# Patient Record
Sex: Male | Born: 1960 | Race: White | Hispanic: No | Marital: Single | Smoking: Current every day smoker
Health system: Southern US, Community
[De-identification: ages and names within clinical notes are randomized; demographics above are authoritative.]

## PROBLEM LIST (undated history)

## (undated) ENCOUNTER — Encounter

## (undated) ENCOUNTER — Ambulatory Visit: Payer: MEDICARE

## (undated) ENCOUNTER — Telehealth: Attending: Clinical | Primary: Clinical

## (undated) ENCOUNTER — Encounter: Attending: Radiation Oncology | Primary: Radiation Oncology

## (undated) ENCOUNTER — Encounter: Attending: Speech-Language Pathologist | Primary: Speech-Language Pathologist

## (undated) ENCOUNTER — Telehealth

## (undated) ENCOUNTER — Encounter: Attending: Oncology | Primary: Oncology

## (undated) ENCOUNTER — Ambulatory Visit

## (undated) ENCOUNTER — Telehealth
Attending: Student in an Organized Health Care Education/Training Program | Primary: Student in an Organized Health Care Education/Training Program

## (undated) ENCOUNTER — Encounter
Attending: Student in an Organized Health Care Education/Training Program | Primary: Student in an Organized Health Care Education/Training Program

## (undated) ENCOUNTER — Encounter: Attending: Medical Oncology | Primary: Medical Oncology

## (undated) ENCOUNTER — Telehealth: Attending: Medical Oncology | Primary: Medical Oncology

## (undated) ENCOUNTER — Ambulatory Visit: Payer: MEDICARE | Attending: Medical Oncology | Primary: Medical Oncology

## (undated) ENCOUNTER — Inpatient Hospital Stay

## (undated) ENCOUNTER — Encounter: Attending: Otolaryngology | Primary: Otolaryngology

## (undated) ENCOUNTER — Ambulatory Visit: Payer: MEDICARE | Attending: Internal Medicine | Primary: Internal Medicine

## (undated) DIAGNOSIS — Z720 Tobacco use: Secondary | ICD-10-CM

## (undated) DIAGNOSIS — C8310 Mantle cell lymphoma, unspecified site: Secondary | ICD-10-CM

## (undated) DIAGNOSIS — F1010 Alcohol abuse, uncomplicated: Secondary | ICD-10-CM

## (undated) HISTORY — DX: Tobacco use: Z72.0

## (undated) HISTORY — DX: Alcohol abuse, uncomplicated: F10.10

## (undated) HISTORY — DX: Mantle cell lymphoma, unspecified site: C83.10

## (undated) MED ORDER — ACETAMINOPHEN 325 MG TABLET: ORAL | 0 days

## (undated) MED ORDER — NAPROXEN SODIUM 220 MG TABLET: ORAL | 0 days | PRN

---
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
## 2011-09-16 DIAGNOSIS — Z9481 Bone marrow transplant status: Secondary | ICD-10-CM | POA: Insufficient documentation

## 2013-09-02 MED ORDER — ONDANSETRON HCL 4 MG TABLET
ORAL | 0 days
Start: 2013-09-02 — End: ?

## 2013-09-02 MED ORDER — OMEPRAZOLE 20 MG CAPSULE,DELAYED RELEASE
ORAL | 0 days
Start: 2013-09-02 — End: ?

## 2013-09-19 MED ORDER — DOCUSATE SODIUM 100 MG CAPSULE
ORAL | 0 days
Start: 2013-09-19 — End: ?

## 2013-09-20 DIAGNOSIS — K8020 Calculus of gallbladder without cholecystitis without obstruction: Secondary | ICD-10-CM | POA: Insufficient documentation

## 2016-09-03 DIAGNOSIS — C8310 Mantle cell lymphoma, unspecified site: Secondary | ICD-10-CM | POA: Insufficient documentation

## 2017-02-11 ENCOUNTER — Ambulatory Visit
Admit: 2017-02-11 | Discharge: 2017-02-11 | Payer: Commercial Managed Care - PPO | Attending: Medical Oncology | Admitting: Medical Oncology

## 2017-02-11 DIAGNOSIS — C8310 Mantle cell lymphoma, unspecified site: Principal | ICD-10-CM

## 2017-02-17 ENCOUNTER — Ambulatory Visit
Admit: 2017-02-17 | Discharge: 2017-02-17 | Payer: Commercial Managed Care - PPO | Attending: Medical Oncology | Admitting: Medical Oncology

## 2017-02-17 DIAGNOSIS — C8310 Mantle cell lymphoma, unspecified site: Principal | ICD-10-CM

## 2017-02-23 ENCOUNTER — Ambulatory Visit
Admission: RE | Admit: 2017-02-23 | Discharge: 2017-02-23 | Disposition: A | Discharge status: Home / Self Care | Payer: MEDICARE

## 2017-02-23 ENCOUNTER — Ambulatory Visit
Admission: RE | Admit: 2017-02-23 | Discharge: 2017-02-23 | Disposition: A | Discharge status: Home / Self Care | Payer: Commercial Managed Care - PPO | Attending: Medical Oncology | Admitting: Medical Oncology

## 2017-02-23 DIAGNOSIS — C8310 Mantle cell lymphoma, unspecified site: Principal | ICD-10-CM

## 2017-03-09 ENCOUNTER — Ambulatory Visit
Admission: RE | Admit: 2017-03-09 | Discharge: 2017-03-09 | Disposition: A | Discharge status: Home / Self Care | Payer: BC Managed Care – PPO

## 2017-03-09 ENCOUNTER — Ambulatory Visit
Admission: RE | Admit: 2017-03-09 | Discharge: 2017-03-09 | Disposition: A | Discharge status: Home / Self Care | Payer: BC Managed Care – PPO | Admitting: Physician Assistant

## 2017-03-09 DIAGNOSIS — C8310 Mantle cell lymphoma, unspecified site: Principal | ICD-10-CM

## 2017-03-09 DIAGNOSIS — Z5111 Encounter for antineoplastic chemotherapy: Principal | ICD-10-CM

## 2017-03-09 DIAGNOSIS — C8318 Mantle cell lymphoma, lymph nodes of multiple sites: Principal | ICD-10-CM

## 2017-03-09 MED ORDER — ALLOPURINOL 300 MG TABLET: 300 mg | tablet | 0 refills | 0 days

## 2017-03-09 MED ORDER — ALLOPURINOL 300 MG TABLET
ORAL_TABLET | Freq: Two times a day (BID) | ORAL | 0 refills | 0.00000 days | Status: CP
Start: 2017-03-09 — End: 2017-04-06

## 2017-03-09 MED ORDER — ONDANSETRON HCL 8 MG TABLET
ORAL_TABLET | Freq: Three times a day (TID) | ORAL | 2 refills | 0.00000 days | Status: CP | PRN
Start: 2017-03-09 — End: 2017-03-09

## 2017-03-09 MED ORDER — ONDANSETRON HCL 8 MG TABLET: 8 mg | tablet | 2 refills | 0 days

## 2017-03-09 MED ORDER — PROCHLORPERAZINE MALEATE 10 MG TABLET: 10 mg | tablet | Freq: Four times a day (QID) | 2 refills | 0 days | Status: AC

## 2017-03-09 MED ORDER — PROCHLORPERAZINE MALEATE 10 MG TABLET
ORAL_TABLET | Freq: Four times a day (QID) | ORAL | 2 refills | 0.00000 days | Status: CP | PRN
Start: 2017-03-09 — End: 2017-11-05

## 2017-03-09 MED FILL — ALLOPURINOL/300MG/TAB: ALLOPURINOL/300MG/TAB | 7 days supply | Qty: 14 | Fill #0

## 2017-03-09 MED FILL — PROCHLORPERAZINE/10MG/TABS: PROCHLORPERAZINE/10MG/TABS | 7 days supply | Qty: 30 | Fill #0

## 2017-03-09 MED FILL — ONDANSETRON/8MG/TAB: ONDANSETRON/8MG/TAB | 10 days supply | Qty: 30 | Fill #0

## 2017-03-10 ENCOUNTER — Ambulatory Visit: Admission: RE | Admit: 2017-03-10 | Discharge: 2017-03-10 | Disposition: A | Discharge status: Home / Self Care

## 2017-03-10 DIAGNOSIS — C8318 Mantle cell lymphoma, lymph nodes of multiple sites: Principal | ICD-10-CM

## 2017-03-16 DIAGNOSIS — Z720 Tobacco use: Secondary | ICD-10-CM | POA: Insufficient documentation

## 2017-03-17 MED ORDER — VARENICLINE 0.5 MG (11)-1 MG (42) TABLETS IN A DOSE PACK
PACK | 0 refills | 0 days | Status: CP
Start: 2017-03-17 — End: 2017-11-05

## 2017-03-17 MED ORDER — NICOTINE 21 MG/24 HR DAILY TRANSDERMAL PATCH
MEDICATED_PATCH | TRANSDERMAL | 2 refills | 0 days | Status: CP
Start: 2017-03-17 — End: 2017-11-05

## 2017-03-17 MED ORDER — VARENICLINE 1 MG TABLET
ORAL_TABLET | Freq: Two times a day (BID) | ORAL | 1 refills | 0 days | Status: CP
Start: 2017-03-17 — End: 2017-11-05

## 2017-04-06 ENCOUNTER — Ambulatory Visit: Admission: RE | Admit: 2017-04-06 | Discharge: 2017-04-06 | Disposition: A | Discharge status: Home / Self Care

## 2017-04-06 ENCOUNTER — Ambulatory Visit
Admission: RE | Admit: 2017-04-06 | Discharge: 2017-04-06 | Disposition: A | Discharge status: Home / Self Care | Attending: Medical Oncology | Admitting: Medical Oncology

## 2017-04-06 DIAGNOSIS — C8318 Mantle cell lymphoma, lymph nodes of multiple sites: Principal | ICD-10-CM

## 2017-04-07 ENCOUNTER — Ambulatory Visit: Admission: RE | Admit: 2017-04-07 | Discharge: 2017-04-07 | Disposition: A | Discharge status: Home / Self Care

## 2017-04-07 DIAGNOSIS — C8318 Mantle cell lymphoma, lymph nodes of multiple sites: Principal | ICD-10-CM

## 2017-06-08 ENCOUNTER — Ambulatory Visit
Admission: RE | Admit: 2017-06-08 | Discharge: 2017-06-08 | Disposition: A | Discharge status: Home / Self Care | Attending: Medical Oncology | Admitting: Medical Oncology

## 2017-06-08 ENCOUNTER — Ambulatory Visit: Admission: RE | Admit: 2017-06-08 | Discharge: 2017-06-08 | Disposition: A | Discharge status: Home / Self Care

## 2017-06-08 DIAGNOSIS — C8310 Mantle cell lymphoma, unspecified site: Principal | ICD-10-CM

## 2017-06-08 DIAGNOSIS — R21 Rash and other nonspecific skin eruption: Principal | ICD-10-CM

## 2017-06-08 DIAGNOSIS — C8318 Mantle cell lymphoma, lymph nodes of multiple sites: Secondary | ICD-10-CM

## 2017-06-09 ENCOUNTER — Ambulatory Visit
Admission: RE | Admit: 2017-06-09 | Discharge: 2017-06-09 | Payer: BC Managed Care – PPO | Attending: Dermatology | Admitting: Dermatology

## 2017-06-09 DIAGNOSIS — R21 Rash and other nonspecific skin eruption: Principal | ICD-10-CM

## 2017-06-15 MED ORDER — CLOBETASOL 0.05 % TOPICAL OINTMENT
Freq: Two times a day (BID) | TOPICAL | 1 refills | 0.00000 days | Status: CP
Start: 2017-06-15 — End: 2017-06-20

## 2017-06-20 MED ORDER — CLOBETASOL 0.05 % TOPICAL OINTMENT
Freq: Two times a day (BID) | TOPICAL | 1 refills | 0.00000 days | Status: CP
Start: 2017-06-20 — End: 2018-06-05

## 2017-06-21 ENCOUNTER — Emergency Department
Admission: EM | Admit: 2017-06-21 | Discharge: 2017-06-21 | Disposition: A | Discharge status: Home / Self Care | Payer: BC Managed Care – PPO | Source: Intra-hospital

## 2017-06-21 ENCOUNTER — Emergency Department
Admission: EM | Admit: 2017-06-21 | Discharge: 2017-06-21 | Disposition: A | Discharge status: Home / Self Care | Payer: BC Managed Care – PPO | Source: Intra-hospital | Attending: Family Medicine | Admitting: Family Medicine

## 2017-06-21 DIAGNOSIS — R05 Cough: Principal | ICD-10-CM

## 2017-06-21 MED ORDER — BENZONATATE 200 MG CAPSULE
ORAL_CAPSULE | Freq: Three times a day (TID) | ORAL | 0 refills | 0 days | Status: CP | PRN
Start: 2017-06-21 — End: 2017-06-28

## 2017-06-21 MED ORDER — AZITHROMYCIN 250 MG TABLET
ORAL_TABLET | Freq: Every day | ORAL | 0 refills | 0 days | Status: CP
Start: 2017-06-21 — End: 2017-06-25

## 2017-06-21 MED ORDER — ALBUTEROL SULFATE HFA 90 MCG/ACTUATION AEROSOL INHALER
0 refills | 0 days | Status: CP
Start: 2017-06-21 — End: 2018-06-10

## 2017-07-12 MED ORDER — NICOTINE 21 MG/24 HR DAILY TRANSDERMAL PATCH
MEDICATED_PATCH | TRANSDERMAL | 2 refills | 0 days | Status: CP
Start: 2017-07-12 — End: 2017-11-05

## 2017-07-23 ENCOUNTER — Emergency Department
Admission: EM | Admit: 2017-07-23 | Discharge: 2017-07-24 | Disposition: A | Discharge status: Home / Self Care | Payer: BC Managed Care – PPO | Source: Intra-hospital | Attending: Family Medicine | Admitting: Family Medicine

## 2017-07-23 ENCOUNTER — Emergency Department
Admission: EM | Admit: 2017-07-23 | Discharge: 2017-07-24 | Disposition: A | Discharge status: Home / Self Care | Payer: BC Managed Care – PPO | Source: Intra-hospital

## 2017-07-23 DIAGNOSIS — R05 Cough: Secondary | ICD-10-CM

## 2017-07-23 DIAGNOSIS — R509 Fever, unspecified: Principal | ICD-10-CM

## 2017-07-24 MED ORDER — LEVOFLOXACIN 500 MG TABLET
ORAL_TABLET | Freq: Every day | ORAL | 0 refills | 0.00000 days | Status: CP
Start: 2017-07-24 — End: 2017-08-03

## 2017-08-01 ENCOUNTER — Ambulatory Visit
Admission: RE | Admit: 2017-08-01 | Discharge: 2017-08-01 | Disposition: A | Discharge status: Home / Self Care | Payer: BC Managed Care – PPO

## 2017-08-01 DIAGNOSIS — C8318 Mantle cell lymphoma, lymph nodes of multiple sites: Principal | ICD-10-CM

## 2017-09-14 DIAGNOSIS — F17200 Nicotine dependence, unspecified, uncomplicated: Secondary | ICD-10-CM | POA: Insufficient documentation

## 2017-09-14 MED ORDER — VARENICLINE 1 MG TABLET
ORAL_TABLET | Freq: Two times a day (BID) | ORAL | 1 refills | 0 days | Status: CP
Start: 2017-09-14 — End: 2017-11-23

## 2017-11-04 ENCOUNTER — Emergency Department
Admit: 2017-11-04 | Discharge: 2017-11-04 | Disposition: A | Discharge status: Home / Self Care | Payer: MEDICARE | Attending: Family Medicine

## 2017-11-04 ENCOUNTER — Ambulatory Visit
Admit: 2017-11-04 | Discharge: 2017-11-04 | Disposition: A | Discharge status: Home / Self Care | Payer: MEDICARE | Attending: Family Medicine

## 2017-11-04 DIAGNOSIS — R1110 Vomiting, unspecified: Principal | ICD-10-CM

## 2017-11-05 ENCOUNTER — Encounter
Admit: 2017-11-05 | Discharge: 2017-11-06 | Discharge status: Against Medical Advice | Payer: MEDICARE | Source: Other Acute Inpatient Hospital

## 2017-11-05 ENCOUNTER — Ambulatory Visit
Admit: 2017-11-05 | Discharge: 2017-11-06 | Discharge status: Against Medical Advice | Payer: MEDICARE | Source: Other Acute Inpatient Hospital

## 2017-11-05 DIAGNOSIS — R7881 Bacteremia: Principal | ICD-10-CM

## 2017-11-05 MED ORDER — AZITHROMYCIN 250 MG TABLET
ORAL_TABLET | Freq: Every day | ORAL | 0 refills | 0.00000 days | Status: CP
Start: 2017-11-05 — End: 2017-11-05

## 2017-11-06 DIAGNOSIS — R7881 Bacteremia: Principal | ICD-10-CM

## 2017-11-10 MED ORDER — GABAPENTIN 100 MG CAPSULE
ORAL | 0 days
Start: 2017-11-10 — End: ?

## 2017-11-11 MED ORDER — PANTOPRAZOLE 40 MG TABLET,DELAYED RELEASE
ORAL | 0 days
Start: 2017-11-11 — End: ?

## 2017-11-24 MED ORDER — CHANTIX 1 MG TABLET
ORAL_TABLET | 1 refills | 0 days | Status: CP
Start: 2017-11-24 — End: 2018-06-10

## 2018-06-05 ENCOUNTER — Ambulatory Visit: Admit: 2018-06-05 | Discharge: 2018-06-05 | Disposition: A | Discharge status: Home / Self Care | Payer: MEDICARE

## 2018-06-05 ENCOUNTER — Emergency Department: Admit: 2018-06-05 | Discharge: 2018-06-05 | Disposition: A | Discharge status: Home / Self Care | Payer: MEDICARE

## 2018-06-05 DIAGNOSIS — L0390 Cellulitis, unspecified: Principal | ICD-10-CM

## 2018-06-05 MED ORDER — SULFAMETHOXAZOLE 800 MG-TRIMETHOPRIM 160 MG TABLET
ORAL_TABLET | Freq: Two times a day (BID) | ORAL | 0 refills | 0 days | Status: CP
Start: 2018-06-05 — End: 2018-06-15

## 2018-06-10 ENCOUNTER — Ambulatory Visit: Admit: 2018-06-10 | Discharge: 2018-06-10 | Disposition: A | Discharge status: Home / Self Care | Payer: MEDICARE

## 2018-06-10 MED ORDER — CLINDAMYCIN HCL 150 MG CAPSULE
ORAL_CAPSULE | Freq: Four times a day (QID) | ORAL | 0 refills | 0 days | Status: CP
Start: 2018-06-10 — End: 2018-06-17

## 2019-09-05 DIAGNOSIS — C8318 Mantle cell lymphoma, lymph nodes of multiple sites: Principal | ICD-10-CM

## 2019-09-10 DIAGNOSIS — C8318 Mantle cell lymphoma, lymph nodes of multiple sites: Principal | ICD-10-CM

## 2019-09-11 DIAGNOSIS — C8318 Mantle cell lymphoma, lymph nodes of multiple sites: Principal | ICD-10-CM

## 2019-09-12 ENCOUNTER — Ambulatory Visit: Admit: 2019-09-12 | Discharge: 2019-09-13 | Payer: MEDICARE

## 2019-09-12 ENCOUNTER — Ambulatory Visit: Admit: 2019-09-12 | Discharge: 2019-09-13 | Payer: MEDICARE | Attending: Medical Oncology | Primary: Medical Oncology

## 2019-09-12 ENCOUNTER — Other Ambulatory Visit: Admit: 2019-09-12 | Discharge: 2019-09-13 | Payer: MEDICARE

## 2019-09-12 DIAGNOSIS — C8318 Mantle cell lymphoma, lymph nodes of multiple sites: Principal | ICD-10-CM

## 2019-09-12 DIAGNOSIS — Q8901 Asplenia (congenital): Secondary | ICD-10-CM | POA: Insufficient documentation

## 2019-09-12 DIAGNOSIS — F1010 Alcohol abuse, uncomplicated: Secondary | ICD-10-CM | POA: Insufficient documentation

## 2019-09-13 DIAGNOSIS — C8318 Mantle cell lymphoma, lymph nodes of multiple sites: Principal | ICD-10-CM

## 2019-09-18 ENCOUNTER — Ambulatory Visit: Admit: 2019-09-18 | Discharge: 2019-09-19 | Payer: MEDICARE | Attending: Otolaryngology | Primary: Otolaryngology

## 2019-09-18 ENCOUNTER — Ambulatory Visit
Admit: 2019-09-18 | Discharge: 2019-09-19 | Payer: MEDICARE | Attending: Speech-Language Pathologist | Primary: Speech-Language Pathologist

## 2019-09-18 DIAGNOSIS — C760 Malignant neoplasm of head, face and neck: Principal | ICD-10-CM

## 2019-09-18 DIAGNOSIS — C8318 Mantle cell lymphoma, lymph nodes of multiple sites: Principal | ICD-10-CM

## 2019-09-20 MED ORDER — PREDNISONE 50 MG TABLET
ORAL_TABLET | Freq: Every day | ORAL | 0 refills | 10.00000 days | Status: CP
Start: 2019-09-20 — End: 2019-09-20

## 2019-09-20 MED ORDER — ZANUBRUTINIB 80 MG CAPSULE
ORAL_CAPSULE | Freq: Two times a day (BID) | ORAL | 2 refills | 0 days | Status: CP
Start: 2019-09-20 — End: ?

## 2019-09-20 MED ORDER — PREDNISONE 50 MG TABLET: 100 mg | tablet | Freq: Every day | 0 refills | 10 days | Status: AC

## 2019-09-20 NOTE — Unmapped (Signed)
[facility] ADULT SPEECH THERAPY [place]  OUTPATIENT SPEECH PATHOLOGY  09/18/2019      Patient Name: Frederick Roman  Date of Birth:08-01-1961  Session Number: 1  Diagnosis: Base of tongue mass     Date of Evaluation: 09/18/19     Referred by: Dr. Jeronimo Norma    Chief Complaint: Difficulty swallowing solid foods status post tongue base mass diagnosis    Note Type: Progress Note(Clinical evaluation of swallowing)    OBJECTIVE:  SLP donned N95 mask, eye shields, and gloves prior to entering clinic room.  Patient subjectively complaining of difficulty with solid food swallowing.  He feels like he is unable to propel food down through his throat and that it tends to get stuck at the top level where his tongue will taken down.  SLP counseled with patient regarding likely mass obstructing flow and propelling of solid food.  Patient denies pain and has not complained of bleeding or other issues with airway.  Further, he denies difficulty with thin liquids all the way through level of very soft solids such as mashed potatoes and gravy or ground beef.  Today, patient was assessed and found to have adequate oral peripheral examination.  He was presented with a solid food in the form of a cracker.  He exhibited proper oral prep phase and proper oral phase.  He continued to complain of difficulty propelling food posteriorly through the throat.  This was his original subjective complaint.  With evaluation, patient was advised to turn his head sharply in both directions to see if there were changes to the flow and to the success of movement of solid foods with the throat.  He reported that with the left head turn that it appeared to be worse.  He was then given another solid food and turned his head sharply to the right with his chin slightly tucked.  He reported this was significantly better.  SLP then counseled with patient that this would be a good strategy.  We then tried a chin tuck to make a comparison, this was not effective. Patient exited clinic with instructions to turn head to the right whenever taking pills or eating solid foods.  He understands that this is not a full proof method but appears to be effective by comparison to other methods or to midline.    Stimulability: Pt was very stimulable     PLAN:    for    Self-directed swallowing strategy applied today.    Prognosis:  Guarded   Negative Prognosis Rationale: (Current diagnosis)      Positive Prognosis Rationale: Pain status, Medical status/condition, Motivation      Goals:  Patient and Family Goals: Swallowing normally     STG 1: Patient well report subjective improvement in swallowing function with applied strategy at 100% accuracy.         Time Frame: 1  Duration: days    LTG #1: Patient will exhibit normal swallowing without subjective complaint.      Time Frame: 6  Duration: months       SUBJECTIVE:  Patient is a 59 y.o. Caucasian  pleasant male being seen at the request of Dr. Jeronimo Norma for evaluation of swallowing due to complaint of solid food dysphagia in the presence of diagnosis of tongue base mass.  Pt presents with h/o mantel cell lymphoma most recently treated in 2/018, who now has 1 month of dysphagia and new tongue base found on CT neck.    Pain?: No  Precautions: Aspiration    Education Provided: Patient    Response to Education: Understanding demonstrated     Communication/Consultation: progress note sent to referring practitioner    Session Duration : 30    I attest that I have reviewed the above information.  Signed: Edwin Cap, CCC-SLP  09/18/2019 9:19 AM

## 2019-09-20 NOTE — Unmapped (Signed)
[facility]SC Specialty Medication Onboarding    Specialty Medication: Brukinsa  Prior Authorization: Not Required   Financial Assistance: No - copay  <$25  Final Copay/Day Supply: $3 / 30    Insurance Restrictions: None     Notes to Pharmacist:     The triage team has completed the benefits investigation and has determined that the patient is able to fill this medication at [facility]. Please contact the patient to complete the onboarding or follow up with the prescribing physician as needed.

## 2019-09-21 NOTE — Unmapped (Signed)
[facility]   Patient Onboarding/Medication Counseling    Frederick Roman is a 59 y.o. male with mantle cell lymphoma who I am counseling today on initiation of therapy.  I am speaking to the patient.    Was a Nurse, learning disability used for this call? No     Verified patient's date of birth / HIPAA.    Specialty medication(s) to be sent: Hematology/Oncology: Brukinsa      Non-specialty medications/supplies to be sent: n/a      Medications not needed at this time: n/a         Brukinsa (Zanubrutinib)    Medication & Administration     Dosage: Take 2 tablets (160 mg) by mouth Two (2) times a day.    Administration:   ? May be administered with or without food.  ? Swallow capsule whole with a full glass of water.  Do not open, break, crush or chew.    Adherence/Missed dose instructions:   ? If you miss a dose, take the dose as soon as you remember on the same day.  If you do not think about the missed dose until the next day, skip the missed dose and go back to your normal time.  ? Do not take 2 doses at the same time or extra doses.    Goals of Therapy     ? To prevent disease progression    Side Effects & Monitoring Parameters     Commonly reported side effects  ? Fatigue, loss of strength and energy  ? Diarrhea, constipation  ? Common cold symptoms, upper respiratory infection, cough  ? Muscle pain  ? Rash  ? Decreased red blood cells, white blood cells and platelets  ? Bruising    The following side effects should be reported to the provider:  ? Signs of infection (fever >100.4, chills, mouth sores/irritation, sputum production)  ? Signs of bleeding (vomiting or coughing up blood, blood that looks like coffee grounds, blood in the urine or black, red tarry stools, bruising that gets bigger without reason, any persistent or severe bleeding, impaired wound healing)  ? Signs of low potassium (muscle pain or weakness, muscle cramps, abnormal heartbeat)  ? Weakness on 1 side of the body, trouble speaking or thinking, change in balance, drooping on one side of the face, or blurred eyesight.  ? Signs of high blood pressure (severe headache, dizziness, passing out, vision changes)  ? Mole changes, skin growth  ? Fast heartbeat, abnormal heartbeat, shortness of breath, chest pain  ? Signs of anaphylaxis (wheezing, chest tightness, swelling of face, lips, tongue or throat)    Monitoring Parameters:   ? Monitor CBC during treatment.   ? Evaluate pregnancy status prior to use in females of reproductive potential.   ? Monitor for signs/symptoms of atrial fibrillation/atrial flutter, bleeding, or infections.  ? Monitor for toxicities in patients with hepatic impairment or severe renal impairment (or on dialysis).   ? Monitor for second primary malignancies.   ? Monitor adherence.    Contraindications, Warnings, & Precautions     ? Cardiovascular effects: Atrial fibrillation and atrial flutter have occurred in a small percentage of patients; ? grade 3 events were reported rarely. Patients with cardiac risk factors, hypertension, and/or acute infections may be at increased risk.   ? Hematologic effects: Grade 3 or 4 cytopenias have been reported.  May require treatment interruption, dose reduction, discontinuation, and/or growth factor support or transfusions.  ? Hemorrhage: Fatal and serious hemorrhagic events  have occurred in patients. Grade 3 or higher bleeding events have been reported in a small percentage of patients. Half of the patients who received zanubrutinib experienced bleeding events of any grade.  Bleeding events have occurred in patients with and without concomitant antiplatelet or anticoagulation therapy. Concurrent administration of zanubrutinib with antiplatelet or anticoagulant medications may further increase the risk of hemorrhage. Discontinue if intracranial hemorrhage (any grade) occurs. Consider the benefit-risk of withholding zanubrutinib for 3 to 7 days before and after surgery, depending upon the type of surgery and the risk of bleeding.  ? Infection: Fatal and serious infections (including bacterial, viral, or fungal) and opportunistic infections have occurred in patients.  Grade 3 or higher infections occurred in nearly one-fourth of patients; pneumonia was the most common ? grade 3 infection. Infections due to hepatitis B virus reactivation have also occurred. Consider prophylaxis for herpes simplex virus, Pneumocystis jirovecii pneumonia, and other infections according to standard of care in patients at increased risk for infections.  ? Secondary malignancies: Second primary malignancies have occurred. The most frequent second primary malignancy was skin cancer (basal cell carcinoma and squamous cell carcinoma of skin). Advise patients to use sun protection.  ? Hepatic impairment: Monitor for toxicities in patients with hepatic impairment. Dose reduction is recommended in patients with severe impairment.  ? Renal impairment: Monitor for toxicities in patients with severe renal impairment (CrCl <30 mL/minute) or on dialysis.  ? Reproductive Considerations  o Evaluate pregnancy status prior to use in females of reproductive potential. Females of reproductive potential should use effective contraception during therapy and for ?1 week after the last dose. Males with male partners of reproductive potential should use effective contraception during therapy and for ?1 week after the last dose.  o Based on data from animal reproduction studies, in utero exposure to zanubrutinib may cause fetal harm.  o It is not known if zanubrutinib is present in breast milk.  Due to the potential for serious adverse reactions in the breastfed infant, breastfeeding is not recommended by the manufacturer during therapy or for ?2 weeks following the last dose.    Drug/Food Interactions     ? Medication list reviewed in Epic. The patient was instructed to inform the care team before taking any new medications or supplements. No drug interactions identified  ? Is the patient on any CYP3A4 inducers? No.  ? Is the patient on any CYP3A4 inhibitors? No.  ? Is the patient taking any antiplatelets? No.  ? Is the patient taking any anticoagulants? No.  ? Complete all age-appropriate inactivated vaccinations at least 2 weeks prior to starting. If vaccinated during therapy, revaccinate at least 3 months after discontinuation.   ? Live-attenuated vaccines should not be given for at least 3 months after therapy. Exceptions: Smallpox and Monkeypox Vaccine     Storage, Handling Precautions, & Disposal     ? Store at room temperature in the original container (do not use a pillbox or store with other medications).   ? Caregivers helping administer medication should wear gloves and wash hands immediately after.    ? Keep the lid tightly closed. Keep out of the reach of children and pets.  ? Do not flush down a toilet or pour down a drain unless instructed to do so.  Check with your local police department or fire station about drug take-back programs in your area.        Current Medications (including OTC/herbals), Comorbidities and Allergies     Current Outpatient Medications  Medication Sig Dispense Refill   ??? celecoxib (CELEBREX) 100 MG capsule Take 200 mg by mouth once as needed.     ??? predniSONE (DELTASONE) 50 MG tablet Take 2 tablets (100 mg total) by mouth daily for 10 days. 20 tablet 0   ??? zanubrutinib 80 mg cap Take 2 tablets (160 mg) by mouth Two (2) times a day. 120 capsule 2     No current facility-administered medications for this visit.        No Known Allergies    Patient Active Problem List   Diagnosis   ??? Mantle cell lymphoma (CMS-HCC)   ??? Tobacco abuse   ??? Bacteremia due to Streptococcus pneumoniae   ??? Alcohol abuse   ??? Gallstones   ??? S/P autologous bone marrow transplantation (CMS-HCC)   ??? Spleen absent   ??? Tobacco dependence       Reviewed and up to date in Epic.    Appropriateness of Therapy     Is medication and dose appropriate based on diagnosis? Yes    Prescription has been clinically reviewed: Yes    Baseline Quality of Life Assessment      How many days over the past month did your lymphoma  keep you from your normal activities? For example, brushing your teeth or getting up in the morning. 0    Financial Information     Medication Assistance provided: None Required    Anticipated copay of $3 reviewed with patient. Verified delivery address.    Delivery Information     Scheduled delivery date: 09/25/19    Expected start date: 09/26/19  DO NOT START UNTIL INSTRUCTED TO DO SO BY PROVIDER    Medication will be delivered via UPS to the prescription address in [facility].  This shipment will not require a signature.      Explained the services we provide at [facility] and that each month we would call to set up refills.  Stressed importance of returning phone calls so that we could ensure they receive their medications in time each month.  Informed patient that we should be setting up refills 7-10 days prior to when they will run out of medication.  A pharmacist will reach out to perform a clinical assessment periodically.  Informed patient that a welcome packet and a drug information handout will be sent.      Patient verbalized understanding of the above information as well as how to contact the pharmacy at [phone] option 4 with any questions/concerns.  The pharmacy is open Monday through Friday 8:30am-4:30pm.  A pharmacist is available 24/7 via pager to answer any clinical questions they may have.    Patient Specific Needs     ? Does the patient have any physical, cognitive, or cultural barriers? No    ? Patient prefers to have medications discussed with  Patient     ? Is the patient or caregiver able to read and understand education materials at a high school level or above? Yes    ? Patient's primary language is  English     ? Is the patient high risk? No     ? Does the patient require a Care Management Plan? No ? Does the patient require physician intervention or other additional services (i.e. nutrition, smoking cessation, social work)? No      Delwin Raczkowski  Anders Grant  [facility] Specialty Pharmacist

## 2019-09-23 NOTE — Unmapped (Signed)
Recent:   What is the date of your last related visit?  02/18 Oncology  Related acute medications Rx'd:  Prednisone  Home treatment tried:  rest    Relevant:   Allergies: Patient has no known allergies.  Medications: None  Health History: Cancer  Weight: not asked      Reason for Disposition  ??? Difficulty falling to sleep or staying asleep    Answer Assessment - Initial Assessment Questions  1. DESCRIPTION: Tell me about your sleeping problem.       Started taking prednisone yesterday, can't sleep tonight  2. ONSET: How long have you been having trouble sleeping? (e.g., days, weeks, months)      Just tonight, 02/20  3. RECURRENT: Have you had sleeping problems before?  If yes: What happened that time? What helped your sleeping problem go away in the past?       No  4. STRESS: Is there anything in your life that is making you feel stressed or tense?      Biopsy results 02/20  5. PAIN: Do you have any pain that is keeping you awake? (e.g., back pain, headache, abdominal pain)      no  6. CAFFEINE ABUSE: Do you drink caffeinated beverages, and how much each day? (e.g., coffee, tea, colas)      Coffee in am  7. SUBSTANCE ABUSE: Do you use any illegal drugs or alcohol?      No  8. OTHER SYMPTOMS: Do you have any other symptoms?  (e.g., difficulty breathing)      No    Protocols used: INSOMNIA-A-AH

## 2019-09-23 NOTE — Unmapped (Signed)
Hem/Onc Phone Triage Note    Caller: Patient    Reason for Call:   Frederick Roman is a 59 y.o. with relapsed mantle cell lymphoma, with recent relapse with a base on tongue lesion and cervical lymphadenopathy.  He was recently started on prednisone 100 mg daily and plan was to start zanubrutinib (although has not yet received this medication).    Pt called in tonight to report insomnia with prednisone.  He reports that he has been taking pred 50 mg BID, as opposed to 100 mg daily as prescribed.  We discussed taking pred 100 mg in the morning, which should help with his insomnia.  He ask for recommendations for insomnia in the event his insomnia persists.  I suggested trying melatonin if needed.  I will also send a message to the pt's primary team to let them know.    Fellow Taking Call:  Doree Barthel  September 23, 2019 3:30 AM

## 2019-09-24 NOTE — Unmapped (Signed)
Frederick Roman called back about the medication BRUKINSA 80 mg and asked that we cancel the order because he was going to have another consult (second opinion) before taking it. I have canceled the shipment.

## 2019-09-24 NOTE — Unmapped (Signed)
IDENTIFICATION: This is a 59 y.o. male who presents for follow up of relapsed mantle cell lymphoma.      PRIMARY ONCOLOGIST: Dr. Iona Coach    ASSESSMENT:   The patient has relapsed mantle cell lymphoma. He was initially diagnosed with mantle cell lymphoma in October, 2011 after presenting with abdominal pain and lymphadenopathy with biopsy consistent with mantle cell lymphoma and staging studies confirming stage IV disease. 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 In February, 2018,  he presented with right inguinal swelling concerning for relapsed disease with PET/CT showing hypermetabolic cervical and abdominopelvic lymphadenopathy and right inguinal biopsy on 10/08/16 consistent with mantle cell lymphoma with a Ki67 30-40% concerning for more aggressive disease. He received 2 cycles of Bendamustine-Rituximab (8-04/2017) with complete response to therapy and was subsequently lost to follow up. Course was complicated by a rash that may have been from the bendamustine.     He presented in February, 2021 with new subcutaneous lesions as well as difficulty swallowing and CT scan with base of tongue and tonsil mass up to 4.6 cm with right cervical chain lymphadenopathy. Biopsy of base of tongue mass consistent with mantle cell lymphoma.     He was started on prednisone 100 mg daily with significant improvement in his swallowing. However, he also has significant side effects from prednisone including insomnia and anxiety.     I discussed my recommendation to start a BTK inhibitor at this point given his symptomatic disease and location of disease which is a problem for swallowing and could eventually become an airway issue. Discussed other options such as CAR-T but discussed that CAR-T therapy is not something he could get immediately (will need insurance authorization and he is getting new insurance on 3/1, will need to get collection and then wait while being manufactured). In addition, given concerns we have had with him about follow up in the past and his social situation, I would want to make sure he is able to be adherent and come to appointments and also has a caregiver plan before being enthusiastic about giving him CAR-T cell therapy.     Therefore, I recommend starting with a BTK inhibitor which could actually be used as a bridge to CAR-T. I had chosen zanubrutinib given its side effect profile is improved over ibrutinib. He expressed concern about the drug and I discussed the other BTK inhibitors that I would be happy to prescribe instead as well as the clinical trial at [facility] of LOXO BTK inhibitor. I discussed that all of these options are good options for him to hopefully get his disease under control and buy him time for [place] to consider CAR-T in the future. I discussed that there is no concern about CAR-T after BTK inhibitors and that the initial trial that led to FDA approval actually required prior BTK inhibitor as part of inclusion criteria. I also discussed that there is some data about increased efficacy of BTK inhibitors in earlier lines of therapy and also some data about synergy of BTK inhibitors and CAR-T. He expressed suspicion and concern about BTK inhibitors, their toxicity, and stated that he only wants CAR-T. He also got angry during the visit.     I emphasized my concern about delaying other therapies given location and my strong recommendation to proceed with therapy now. He expressed concern about making decision now and I discussed my concern that waiting for a long time  could lead to life threatening complications. He expressed desire for second opinion which I did encourage.   Will refer to [facility] for second opinion.     Discussed CAR-T cell therapy, process of getting CAR-T cells and side effects including CRS and neurotoxicity with patient as well.     PLAN:  Diagnosis: Mantle Cell Lymphoma (Ki67 30-40%)  Stage: Relapsed Regimen: HyperCVAD-rituximab x 5 (3 part A, 2 part B); autoSCT with BEAM conditioning (10/15/10); R-Bendamustine x 2 cycles (03/09/17-04/2017 )  - CT neck with base of tongue/tonsil soft tissue mass with right cervical lymphadenopathy with biopsy proven mantle cell lymphoma  - Started on prednisone 100 mg daily to decrease inflammation - will lower to 50 mg daily given increased insomnia and anxiety with higher dose  - Recommend BTK inhibitor at this time (had prescribed zanubrutinib but any BTK inhibitor acceptable). I worry about waiting too long to start treatment given location of mass  - Also will consider for Tecartus. However, I am concerned about prior history of non-compliance as well as lack of social support. Will need to work closely with social work to ensure he can safely get this therapy. His new insurance kicks in on 3/1 so cannot get insurance authorization until after that. Therefore, it will likely be several months until he could get treated with CAR-T cell therapy    Tobacco abuse: Will refer for smoking cessation.      - RTC in 2 weeks to check in about next steps. I will also reach out to ENT to assess current stability of his airway.     I personally spent 75 minutes face-to-face and non-face-to-face in the care of this patient, which includes all pre, intra, and post visit time on the date of service.          Iona Coach, MD  ----------------    HISTORY OF PRESENT ILLNESS:    Oncology History Overview Note   05/18/10: Presented to ED at [facility] [place] with abdominal pain x 1 day as well as subacute onset o fback pain. Noted bilateral inguinal adenopathy x several months prior to development of pain. No fever, night sweats, weight loss.   05/18/10: CT C/A/P: Multiple, bilateral enlarged axillary nodes, largest measuring 4 cm. Several enlarged mediastinal lymph nodes with conglomerate of anterior mediastinal nodes measuring 2.9 cm and subcarinal node 2.8 cm. A few tiny defects in liver possibly related to lymphoma. The spleen appeared completely infiltrated and measured 8.5 x 4.1 cm. There was extensive enlarged retroperitoneal, periportal, mesenteric, bilateral iliac and inguinal adenopathy. A right inguinal lymph node measured 4.8 cm. Matted left retroperitoneal lymphadenopathy measured approximately 5.6 x 5 cm. A periportal lymph node measured 4.8 cm. There were several mesenteric mass lesions likely representing lymphadenopathy, both within the left upper quadrant and right lower quadrant in the region of the appendix measuring 2.8 cm. There was dilation of the distal esophagus with a questionable mass at the GE junction. There was marked thickening of the mid body and antrum of the stomach as well, suggesting lymphomatous infiltration.   05/19/10: Left inguinal lymph node biopsy - findings consistent with mantle cell lymphoma .  05/26/10: Bone marrow biopsy - consistent with lymphoma involvement  06/15/10: Started on rituximab hyperCVAD. 2 complete cycles of hyperCVAD (parts A and B) and 3rd cycle of part A hyperCVAD. Had bump in transaminases after part A hyperCVAD and rituximab and started on actigall. Admitted after each cycle of part B with fever (negative blood cultures).  10/15/10: BEAM followed by autologous stem cell rescue. Course complicated by mild to moderate mucositis, C diff diarrhea.  09/2016: Presented with right inguinal swelling and sweats concerning for relapse.   09/24/16: PET/CT: Hypermetabolic cervical and abdominopelvic lymphadenopathy concerning for relapsed MCL ([place] 5). Interval development of hypermetabolic lymphadenopathy along bilateral cervical chains (SUV max 6.3 in right level 2 lymph node). New, small mildly avid retroperitoneal lymph nodes, SUV max 2.7. New hypermetabolic right common/external iliac and inguinal lymphadenopathy measuring up to 3.9 cm, SUV max 9.6.   10/08/16: Right inguinal lymph node biopsy - mantle cell lymphoma (Ki 67 30-40%) INTERVAL HISTORY:   Since his last visit, he was seen by ENT and had biopsy of base of tongue mass which is diagnostic of mantle cell lymphoma. He notes significant improvement in his swallowing since starting steroids. However, the steroids have also kept him up at night and have caused his head to scramble. He does feel more wired and anxious as well.         PAST MEDICAL HISTORY:  Past Medical History:   Diagnosis Date   ??? 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 Mantle cell lymphoma (CMS-HCC)        MEDICATIONS:  Current Outpatient Medications   Medication Sig Dispense Refill   ??? celecoxib (CELEBREX) 100 MG capsule Take 200 mg by mouth once as needed.     ??? predniSONE (DELTASONE) 50 MG tablet Take 2 tablets (100 mg total) by mouth daily for 10 days. 20 tablet 0     No current facility-administered medications for this visit.        ALLERGIES:  No Known Allergies    SOCIAL HISTORY:  Social History     Socioeconomic History   ??? Marital status: Single     Spouse name: None   ??? Number of children: None   ??? Years of education: None   ??? Highest education level: None   Occupational History   ??? None   Social Needs   ??? Financial resource strain: None   ??? Food insecurity     Worry: None     Inability: None   ??? Transportation needs     Medical: None     Non-medical: None   Tobacco Use   ??? Smoking status: Current Every Day Smoker     Packs/day: 1.00     Years: 30.00     Pack years: 30.00     Types: Cigarettes   ??? Smokeless tobacco: Never Used   ??? Tobacco comment: Counseling given 03/16/17.   Substance and Sexual Activity   ??? Alcohol use: No   ??? Drug use: No   ??? Sexual activity: None   Lifestyle   ??? Physical activity     Days per week: Patient refused     Minutes per session: Patient refused   ??? Stress: Only a little   Relationships   ??? Social Wellsite geologist on phone: Patient refused     Gets together: Patient refused     Attends religious service: Patient refused     Active member of club or organization: Patient refused Attends meetings of clubs or organizations: Patient refused     Relationship status: Patient refused   Other Topics Concern   ??? Do you use sunscreen? No   ??? Tanning bed use? No   ??? Are you easily burned? No   ??? Excessive sun exposure? No   ??? Blistering sunburns?  No   Social History Narrative   ??? None       FAMILY HISTORY:  Family History   Problem Relation Age of Onset   ??? Breast cancer Mother    ??? Melanoma Neg Hx    ??? Squamous cell carcinoma Neg Hx    ??? Basal cell carcinoma Neg Hx        REVIEW OF SYSTEMS:  See HPI. A 10 system ROS is otherwise negative.    VITAL SIGNS:   Vitals:    09/26/19 1038   BP: 148/83   Pulse: 64   Resp: 18   Temp: 36.6 ??C (97.8 ??F)   TempSrc: Oral   SpO2: 96%   Weight: 99.3 kg (218 lb 14.4 oz)   Height: 177.8 cm (5' 10)       EXAM:  ECOG: 1  CONSTITUTIONAL: NAD, Awake, Alert  HEENT: Oropharynx clear. Left eye amblyopia (baseline per patient)  LYMPH:  Right level 2a lymphadenopathy (~3 cm)  RESPIRATORY: Clear to auscultation bilaterally.  CARDIOVASCULAR: Regular rate and rhythm. No rubs, gallops or murmurs.   GI: Soft, nontender, nondistended. No hepatosplenomegaly.   MUSCULOSKELETAL: No edema.  SKIN: No rashes. Hard, fixed, subcutaneous lesion (~2 cm) on scalp. No erythema.   NEUROLOGIC: No focal deficits.  PSYCHIATRIC: Normal mood and affect.     LABORATORY:  Lab on 09/26/2019   Component Date Value Ref Range Status   ??? Sodium 09/26/2019 137  135 - 145 mmol/L Final   ??? Potassium 09/26/2019 4.3  3.5 - 5.0 mmol/L Final   ??? Chloride 09/26/2019 104  98 - 107 mmol/L Final   ??? Anion Gap 09/26/2019 8  7 - 15 mmol/L Final   ??? CO2 09/26/2019 25.0  22.0 - 30.0 mmol/L Final   ??? BUN 09/26/2019 14  7 - 21 mg/dL Final   ??? Creatinine 09/26/2019 0.86  0.70 - 1.30 mg/dL Final   ??? BUN/Creatinine Ratio 09/26/2019 16   Final   ??? EGFR CKD-EPI Non-African American,* 09/26/2019 >90  >=60 mL/min/1.68m2 Final   ??? EGFR CKD-EPI African American, Male 09/26/2019 >90  >=60 mL/min/1.46m2 Final   ??? Glucose 09/26/2019 112  70 - 179 mg/dL Final   ??? Calcium 16/05/9603 8.7  8.5 - 10.2 mg/dL Final   ??? Albumin 54/04/8118 4.1  3.5 - 5.0 g/dL Final   ??? Total Protein 09/26/2019 6.8  6.5 - 8.3 g/dL Final   ??? Total Bilirubin 09/26/2019 0.4  0.0 - 1.2 mg/dL Final   ??? AST 14/78/2956 20  19 - 55 U/L Final   ??? ALT 09/26/2019 21  <50 U/L Final   ??? Alkaline Phosphatase 09/26/2019 68  38 - 126 U/L Final   ??? WBC 09/26/2019 7.0  4.5 - 11.0 10*9/L Final   ??? RBC 09/26/2019 4.23* 4.50 - 5.90 10*12/L Final   ??? HGB 09/26/2019 13.5  13.5 - 17.5 g/dL Final   ??? HCT 21/30/8657 41.9  41.0 - 53.0 % Final   ??? MCV 09/26/2019 99.2  80.0 - 100.0 fL Final   ??? MCH 09/26/2019 31.9  26.0 - 34.0 pg Final   ??? MCHC 09/26/2019 32.2  31.0 - 37.0 g/dL Final   ??? RDW 84/69/6295 15.5* 12.0 - 15.0 % Final   ??? MPV 09/26/2019 8.0  7.0 - 10.0 fL Final   ??? Platelet 09/26/2019 312  150 - 440 10*9/L Final   ??? Neutrophils % 09/26/2019 84.3  % Final   ??? Lymphocytes % 09/26/2019 11.3  % Final   ???  Monocytes % 09/26/2019 3.4  % Final   ??? Eosinophils % 09/26/2019 0.1  % Final   ??? Basophils % 09/26/2019 0.1  % Final   ??? Absolute Neutrophils 09/26/2019 5.9  2.0 - 7.5 10*9/L Final   ??? Absolute Lymphocytes 09/26/2019 0.8* 1.5 - 5.0 10*9/L Final   ??? Absolute Monocytes 09/26/2019 0.2  0.2 - 0.8 10*9/L Final   ??? Absolute Eosinophils 09/26/2019 0.0  0.0 - 0.4 10*9/L Final   ??? Absolute Basophils 09/26/2019 0.0  0.0 - 0.1 10*9/L Final   ??? Large Unstained Cells 09/26/2019 1  0 - 4 % Final   ??? Macrocytosis 09/26/2019 Slight* Not Present Final       IMAGING:  CT Neck Soft Tissue w/ Contrast (09/12/19):  FINDINGS:   The visualized portions of the brain and the posterior fossa are normal.  ??  The paranasal sinuses are normal. The orbits are normal. The nasal cavity and nasopharynx are normal. Poor dentition.  ??  There is a right base of tongue/tonsil soft tissue mass measuring 3.4 x 2.2 x 4.6 cm extending inferiorly to the level of the epiglottis (9:57, 13:31) with narrowing of the oropharynx and effacement of the right vallecula. There is also extension into the posterior left piriform sinus The lesion crosses the midline at the base of tongue. The salivary glands are normal.  ??  The larynx and hypopharynx are normal.  ??  Multiple prominent right cervical chain lymph nodes involving levels II, III, IV, and V, measuring up to 1.8 cm (9:58). Prominent left level IIA lymph node measuring 0.9 cm (9:53). The largest lymph node measures 2.0 x 2.2 x 3.1 cm in size on the right at level IIA.  ??  The thyroid gland is normal.  ??  Multilevel degenerative changes of the spine most prominent at C5-C6. The lung apices are normal.    ??  Normal intravascular enhancement is seen.  ??  IMPRESSION:  -Right base of tongue and tonsil mass measuring up to 4.6 cm concerning for malignancy and crossing the midline.  ??  -Right cervical chain lymphadenopathy (II-V) measuring up to 2.0 cm in the short axis at IIA. Findings are concerning for lymphoma recurrence versus lymphatic spread of primary oropharyngeal malignancy.          CT Chest W/ Contrast (09/12/19):  FINDINGS:   ??  Mild tracheal debris. No pleural effusion. No lung mass or consolidation. Few scattered pulmonary micronodules unchanged; reference right upper lobe 0.4 cm nodule (image 51, series 10).  ??  No mediastinal, hilar, or axillary lymphadenopathy.   ??  Heart size normal. No pericardial effusion. Thoracic aorta normal caliber.  ??  Osseous structures unremarkable.  ??  See separately reported CT neck and CT abdomen/pelvis for findings above the thoracic inlet and below the diaphragm respectively.  ??  ??  IMPRESSION:  ??  No thoracic lymphadenopathy.  ??  Several scattered nonspecific pulmonary micronodules unchanged.    CT Abdomen/Pelvis (09/12/19):  FINDINGS:  LOWER THORAX: Please see same day chest CT for findings above the diaphragm.   ??  HEPATOBILIARY: Ill-defined area of hyperattenuation in segment 7, hemangioma versus perfusion defect. It appears to be present on prior PET dated August 01, 2017 (3:88), more conspicuous on this postcontrast examination. Cholecystectomy. No biliary dilatation.    SPLEEN: Splenectomy. Small soft tissue nodules in the splenectomy bed.  PANCREAS: Unremarkable.  ??  ADRENALS: Unremarkable.  KIDNEYS/URETERS: Symmetric nephrograms. Small right upper pole cyst. Left pelviectasis. No hydronephrosis.  ??  BLADDER: Unremarkable.  PELVIC/REPRODUCTIVE ORGANS: Unremarkable.  ??  GI TRACT: No dilated or thick walled loops of bowel. Normal appendix.  ??  PERITONEUM/RETROPERITONEUM AND MESENTERY: No free air or fluid. 1.2 cm mesenteric soft tissue nodule adjacent to the splenic flexure (11:59). Additional 8 mm mesenteric soft tissue nodule inferiorly in the left lower quadrant (11:72).  LYMPH NODES: No enlarged lymph nodes.   VESSELS: The aorta is normal in caliber.  No significant calcified atherosclerotic disease. The portal venous system is patent. The hepatic veins and IVC are unremarkable.  ??  BONES AND SOFT TISSUES: Unremarkable.  ??  ??  IMPRESSION:  Sequelae of prior splenectomy with multiple small soft tissue nodules within the mesentery left hemiabdomen. Nodules are similar in size and appearance since 08/01/2017 PET/CT. No new evidence of disease in the abdomen or pelvis.  ??  Ill-defined area of hyperattenuation in hepatic segment 7, likely benign as detailed above. Recommend attention on follow-up.  ??    Pathology:   09/18/19:  Base of tongue mass, biopsy  -  Involved by mantle cell lymphoma (see Comment)  Microscopic examination substantiates the above diagnosis.  ??  The Diff-Quik stained touch preparation shows a monomorphic population of small, mature-appearing lymphocytes.  ??  The H&E stained sections show squamous mucosa with a dense infiltrate of small lymphocytes predominantly involving the submucosa, with focal percolation into the surface mucosa consistent with lymphoepithelial lesions.  ??  Special Stains:  Appropriate controls for each stain have been evaluated and stain as expected.  ??  CD3 and CD20 are performed by immunohistochemistry and/or in situ hybridization in addition to flow cytometry for histopathologic and immunophenotypic correlation.  ??  Sections of block A1 are stained.   CD3: CD3 stains scattered small lymphocytes  CD20: CD20 stains numerous small lymphocytes  Cyclin-D1: Cyclin-D1 highlights epithelial cells and rare lymphocytes  Ki-67: Ki-67 stains approximately 20% of the lymphoid cells

## 2019-09-24 NOTE — Unmapped (Signed)
Hi,     Patient contacted the Communication Center requesting results of the following:     Procedure: Surgical pathology  Completed On: 09/18/19  Program:   Speciality:     Please contact Verne Grain at [phone] for proper follow up.    Check Indicates criteria has been reviewed and confirmed with the patient:    []  Preferred Name   [x]  DOB and/or MR#  [x]  Preferred Contact Method  [x]  Phone Number(s)   []  MyChart     Thank you,   Christell Faith  [facility]   [phone]

## 2019-09-26 ENCOUNTER — Other Ambulatory Visit: Admit: 2019-09-26 | Discharge: 2019-09-26 | Payer: MEDICARE

## 2019-09-26 ENCOUNTER — Ambulatory Visit: Admit: 2019-09-26 | Discharge: 2019-09-26 | Payer: MEDICARE | Attending: Medical Oncology | Primary: Medical Oncology

## 2019-09-26 ENCOUNTER — Ambulatory Visit: Admit: 2019-09-26 | Discharge: 2019-09-26 | Payer: MEDICARE | Attending: Oncology | Primary: Oncology

## 2019-09-26 DIAGNOSIS — C8318 Mantle cell lymphoma, lymph nodes of multiple sites: Principal | ICD-10-CM

## 2019-09-26 LAB — COMPREHENSIVE METABOLIC PANEL
ALBUMIN: 4.1 g/dL (ref 3.5–5.0)
ALKALINE PHOSPHATASE: 68 U/L (ref 38–126)
ALT (SGPT): 21 U/L (ref <50)
ANION GAP: 8 mmol/L (ref 7–15)
AST (SGOT): 20 U/L (ref 19–55)
BILIRUBIN TOTAL: 0.4 mg/dL (ref 0.0–1.2)
BLOOD UREA NITROGEN: 14 mg/dL (ref 7–21)
BUN / CREAT RATIO: 16
CALCIUM: 8.7 mg/dL (ref 8.5–10.2)
CHLORIDE: 104 mmol/L (ref 98–107)
CREATININE: 0.86 mg/dL (ref 0.70–1.30)
EGFR CKD-EPI AA MALE: >90 mL/min/{1.73_m2} (ref >=60)
EGFR CKD-EPI NON-AA MALE: >90 mL/min/{1.73_m2} (ref >=60)
GLUCOSE RANDOM: 112 mg/dL (ref 70–179)
POTASSIUM: 4.3 mmol/L (ref 3.5–5.0)
PROTEIN TOTAL: 6.8 g/dL (ref 6.5–8.3)
SODIUM: 137 mmol/L (ref 135–145)

## 2019-09-26 LAB — CBC W/ AUTO DIFF
BASOPHILS RELATIVE PERCENT: 0.1 %
EOSINOPHILS RELATIVE PERCENT: 0.1 %
HEMATOCRIT: 41.9 % (ref 41.0–53.0)
HEMOGLOBIN: 13.5 g/dL (ref 13.5–17.5)
LARGE UNSTAINED CELLS: 1 % (ref 0–4)
LYMPHOCYTES ABSOLUTE COUNT: 0.8 10*9/L — ABNORMAL LOW (ref 1.5–5.0)
LYMPHOCYTES RELATIVE PERCENT: 11.3 %
MEAN CORPUSCULAR HEMOGLOBIN CONC: 32.2 g/dL (ref 31.0–37.0)
MEAN CORPUSCULAR HEMOGLOBIN: 31.9 pg (ref 26.0–34.0)
MEAN CORPUSCULAR VOLUME: 99.2 fL (ref 80.0–100.0)
MEAN PLATELET VOLUME: 8 fL (ref 7.0–10.0)
MONOCYTES ABSOLUTE COUNT: 0.2 10*9/L (ref 0.2–0.8)
MONOCYTES RELATIVE PERCENT: 3.4 %
NEUTROPHILS ABSOLUTE COUNT: 5.9 10*9/L (ref 2.0–7.5)
NEUTROPHILS RELATIVE PERCENT: 84.3 %
PLATELET COUNT: 312 10*9/L (ref 150–440)
RED BLOOD CELL COUNT: 4.23 10*12/L — ABNORMAL LOW (ref 4.50–5.90)
RED CELL DISTRIBUTION WIDTH: 15.5 % — ABNORMAL HIGH (ref 12.0–15.0)
WBC ADJUSTED: 7 10*9/L (ref 4.5–11.0)

## 2019-09-26 LAB — EOSINOPHILS RELATIVE PERCENT: Eosinophils/100 leukocytes:NFr:Pt:Bld:Qn:Automated count: 0.1

## 2019-09-26 LAB — ALT (SGPT): Alanine aminotransferase:CCnc:Pt:Ser/Plas:Qn:: 21

## 2019-09-26 NOTE — Unmapped (Signed)
Called patient to see if he would like to move appt up to morning slot.  Patient said that was fine and confirmed the location for registration and check in.

## 2019-09-26 NOTE — Unmapped (Signed)
Labs collected via #23 butterfly & sent for analysis.  To next appt.  Care provided by [employer] fundraiser.

## 2019-09-26 NOTE — Unmapped (Signed)
Lab on 09/26/2019   Component Date Value Ref Range Status   ??? Sodium 09/26/2019 137  135 - 145 mmol/L Final   ??? Potassium 09/26/2019 4.3  3.5 - 5.0 mmol/L Final   ??? Chloride 09/26/2019 104  98 - 107 mmol/L Final   ??? Anion Gap 09/26/2019 8  7 - 15 mmol/L Final   ??? CO2 09/26/2019 25.0  22.0 - 30.0 mmol/L Final   ??? BUN 09/26/2019 14  7 - 21 mg/dL Final   ??? Creatinine 09/26/2019 0.86  0.70 - 1.30 mg/dL Final   ??? BUN/Creatinine Ratio 09/26/2019 16   Final   ??? EGFR CKD-EPI Non-African American,* 09/26/2019 >90  >=60 mL/min/1.62m2 Final   ??? EGFR CKD-EPI African American, Male 09/26/2019 >90  >=60 mL/min/1.67m2 Final   ??? Glucose 09/26/2019 112  70 - 179 mg/dL Final   ??? Calcium 09/81/1914 8.7  8.5 - 10.2 mg/dL Final   ??? Albumin 78/29/5621 4.1  3.5 - 5.0 g/dL Final   ??? Total Protein 09/26/2019 6.8  6.5 - 8.3 g/dL Final   ??? Total Bilirubin 09/26/2019 0.4  0.0 - 1.2 mg/dL Final   ??? AST 30/86/5784 20  19 - 55 U/L Final   ??? ALT 09/26/2019 21  <50 U/L Final   ??? Alkaline Phosphatase 09/26/2019 68  38 - 126 U/L Final   ??? WBC 09/26/2019 7.0  4.5 - 11.0 10*9/L Final   ??? RBC 09/26/2019 4.23* 4.50 - 5.90 10*12/L Final   ??? HGB 09/26/2019 13.5  13.5 - 17.5 g/dL Final   ??? HCT 69/62/9528 41.9  41.0 - 53.0 % Final   ??? MCV 09/26/2019 99.2  80.0 - 100.0 fL Final   ??? MCH 09/26/2019 31.9  26.0 - 34.0 pg Final   ??? MCHC 09/26/2019 32.2  31.0 - 37.0 g/dL Final   ??? RDW 41/32/4401 15.5* 12.0 - 15.0 % Final   ??? MPV 09/26/2019 8.0  7.0 - 10.0 fL Final   ??? Platelet 09/26/2019 312  150 - 440 10*9/L Final   ??? Neutrophils % 09/26/2019 84.3  % Final   ??? Lymphocytes % 09/26/2019 11.3  % Final   ??? Monocytes % 09/26/2019 3.4  % Final   ??? Eosinophils % 09/26/2019 0.1  % Final   ??? Basophils % 09/26/2019 0.1  % Final   ??? Absolute Neutrophils 09/26/2019 5.9  2.0 - 7.5 10*9/L Final   ??? Absolute Lymphocytes 09/26/2019 0.8* 1.5 - 5.0 10*9/L Final   ??? Absolute Monocytes 09/26/2019 0.2  0.2 - 0.8 10*9/L Final   ??? Absolute Eosinophils 09/26/2019 0.0  0.0 - 0.4 10*9/L Final   ??? Absolute Basophils 09/26/2019 0.0  0.0 - 0.1 10*9/L Final   ??? Large Unstained Cells 09/26/2019 1  0 - 4 % Final   ??? Macrocytosis 09/26/2019 Slight* Not Present Final       Please call us if you experience:   1. Nausea or vomiting not controlled by nausea medicines  2. Fever of 100.5 F or higher   3. Uncontrolled pain  4. Any other concerning symptom     For health related questions call: Please call the office at [phone] and ask to speak with triage line.  For appointment changes call: Main Clinic [phone]  After hours, for emergencies only, call [facility] and ask for the Oncology Fellow on call: [phone]    Iona Coach, MD  [facility] Hematology/Oncology

## 2019-09-27 NOTE — Unmapped (Signed)
Counseled Frederick Roman, 59 y.o. for treatment of tobacco use/dependence. Pt had expressed interest in counseling via automated phone program.    SUMMARY: Counselor assessed pt's current tobacco use and history of use, and used MI to elicit motivation. Counselor provided evidence-based treatment for tobacco use and discussed tobacco cessation medications. Pt stated that he was interested in using the nicotine patches and varenicline. Pt reported that he used them together during one of his previous attempts and they helped him significantly. Counselor discussed with pt the proper patch dose he should use. Counselor suggested pt use a 21mg  + 14mg  patch together due to his 1.5ppd smoking status. Pt reported that he has never used two before, but was willing to try. Counselor informed pt that he could take the 14mg  patch off if he started feeling nauseous or lightheaded. Pt receptive to suggestion. Counselor was unable to go over behavioral strategies with pt due to the limited amount of time he was willing to stay on the phone. Counselor made plans to follow up with pt.    Tobacco Use Treatment  Program: [facility]  Type of Visit: Initial  Session Number: 1  Tobacco Use Treatment Visit: Talked with patient  Permission To Engage In Conversation Re: PHI w/ Visitors Present: n/a  Goals Of Session: Insight, increase, Assessment, Behavior management, improve    Cancer Center Patients  Primary Service: Other(TA)  Primary Cancer Type: Leukemia, Lymphoma, Myeloma    Tobacco Use During Past 30 Days  Time Since Last Tobacco Use: smoked a cigarette today (at least one puff)  Tobacco Withdrawal (Past 24 Hours): None noted  Type of Tobacco Products Used: Cigarettes  Quantity Used: 30  Quantity Per: day    Tobacco Use History  Age Began Use (Years Old): 16  Longest Time Without Tobacco: Years  # of Hours, Days, Weeks, Months or Years: 3  Most Recent Attempt: 1-5 years  Medications Used in Past Attempts: Varenicline, Nicotine Patch  Previously seen by N[facility]?: [facility]    Behavioral Assessment  Why Uses: 1. Habit 2. Stress/anxiety  Reasons to Become Tobacco Free: 1. Health  Barriers/Challenges: 1. Long-standing habit 2. Pt stated his insurance will be changing soon & doesn't know how that will effect his scripts  Strategies: 1. Varenicline 2. Nicotine patches    Treatment Plan  Cessation Meds Currently Using: None  Outpatient/Discharge Medications Recommended: Patch 35mg , Varenicline  Plan to Obtain Outpatient Meds: TTS messaged providers for Rx(Msg'd Edwyna Ready)  Patient's Plan Post Discharge/Visit: Plan to quit as soon as possible  Follow-up Plan: Phone follow-up scheduled  Family Members Included in Intervention/Plan: No  Comments/Notes: Pt stated in the beginning of call that he couldn't talk long which prevented counselor from obtaining a thorough assessment    TTS Information  Diagnosis: Tobacco use disorder, unspecified, uncomplicated (F17.200)  Interventions: Assessed, Discussed, Informed, Motivational interviewing, Suggested, Taught, Tx plan development, Psycho-education, Encouraged  TTS Visit Length: 3-10 minutes    Doristine Devoid  Tobacco Treatment Counselor  [facility]  [phone]

## 2019-09-27 NOTE — Unmapped (Signed)
Called patient and informed to reduce his Prednisone dose to 50mg  (from 100mg ) vs stopping it altogether. Explained this is to reduce side-effects while still helping his swallowing and comfort. Instructed to take the dose in the morning with food to minimize sleep disturbance. Patient verbalizes understanding and appreciation for the call.

## 2019-10-01 MED ORDER — VARENICLINE 0.5 MG (11)-1 MG (42) TABLETS IN A DOSE PACK
PACK | 0 refills | 0 days | Status: CP
Start: 2019-10-01 — End: 2019-12-30

## 2019-10-01 MED ORDER — NICOTINE 14 MG/24 HR DAILY TRANSDERMAL PATCH
MEDICATED_PATCH | TRANSDERMAL | 2 refills | 28 days | Status: CP
Start: 2019-10-01 — End: ?

## 2019-10-01 MED ORDER — NICOTINE 21 MG/24 HR DAILY TRANSDERMAL PATCH
MEDICATED_PATCH | TRANSDERMAL | 2 refills | 28 days | Status: CP
Start: 2019-10-01 — End: ?

## 2019-10-01 MED ORDER — VARENICLINE 1 MG TABLET
ORAL_TABLET | Freq: Two times a day (BID) | ORAL | 1 refills | 30 days | Status: CP
Start: 2019-10-01 — End: 2019-12-30

## 2019-10-01 NOTE — Unmapped (Signed)
Called to see how patient was doing on half dose (50mg ) of Prednisone in terms of swallowing and sleep. Patient reports that he did not start taking the half dose until this morning, which meant he did not take Prednisone at all on Fri/Sat/Sun. After these three days his difficulty swallowing returned, but his sleep went back to normal. He remembered about our conversation this morning and took the half dose. Advised to continue 50 mg daily. I will check in with him later this week to see how he is tolerating it and to make sure his swallowing is better. Patient verbalizes understanding and appreciation for the call.

## 2019-10-05 NOTE — Unmapped (Signed)
Called patient to see how his swallowing and sleep was on the lower dose of 50mg  Prednisone. Patient states swallowing is better and sleep is better as well even though not back to normal. We agreed this is a good middle ground for now as he waits to treatment decision and plan. Patient is at [facility] today for a second opinion. Patient also states he cancelled his PET because PET was previously rescheduled yet he got a bill for it stating insurance paid for it. States he will call billing when he gets a chance to resolve this prior to re-scheduling. Wished patient good luck with his appt and we will see him next week.

## 2019-10-06 NOTE — Unmapped (Signed)
TC to the patient. Purpose of this call was to establish TNC contact and plan for [facility] evaluation.  Patient states he is going to keep his appointment with Dr. Noralee Stain on 10/10/19, but will probably not be going forward with further care at [facility] after that. He will be going to [facility].  He has been billed for a PET scan that he never received.  Plan to meet with him on Wed 10/10/19 if he would like to  pursue [facility] therapy after his discussion with Dr. Noralee Stain. Will inform Dr. Noralee Stain and SW of this update.

## 2019-10-10 NOTE — Unmapped (Signed)
This patient has been disenrolled from the [facility] specialty pharmacy services due to patient decision not to intiate therapy at this time.Nanetta Batty  [facility] Specialty Pharmacist

## 2020-03-20 NOTE — Progress Notes
This encounter was created in error - please disregard.

## 2020-03-24 ENCOUNTER — Encounter: Admit: 2020-03-24 | Discharge: 2020-03-25 | Payer: MEDICAID | Attending: Urgent Care | Primary: Urgent Care

## 2020-03-24 DIAGNOSIS — L02419 Cutaneous abscess of limb, unspecified: Principal | ICD-10-CM

## 2020-03-24 MED ORDER — SULFAMETHOXAZOLE 800 MG-TRIMETHOPRIM 160 MG TABLET
ORAL_TABLET | Freq: Two times a day (BID) | ORAL | 0 refills | 7.00000 days | Status: CP
Start: 2020-03-24 — End: 2020-03-31

## 2020-04-03 MED ORDER — PROCHLORPERAZINE MALEATE 10 MG TABLET
Freq: Four times a day (QID) | ORAL | 0 days | PRN
Start: 2020-04-03 — End: ?

## 2020-05-08 DIAGNOSIS — C8311 Mantle cell lymphoma, lymph nodes of head, face, and neck: Secondary | ICD-10-CM | POA: Diagnosis not present

## 2020-05-27 ENCOUNTER — Ambulatory Visit: Admit: 2020-05-27 | Discharge: 2020-05-28 | Payer: MEDICARE

## 2020-05-27 DIAGNOSIS — H0589 Other disorders of orbit: Secondary | ICD-10-CM | POA: Insufficient documentation

## 2020-05-27 DIAGNOSIS — H04201 Unspecified epiphora, right lacrimal gland: Secondary | ICD-10-CM | POA: Insufficient documentation

## 2020-06-11 ENCOUNTER — Telehealth: Payer: Self-pay | Admitting: Oncology

## 2020-06-11 NOTE — Telephone Encounter (Signed)
Reschedule patient's 12/7 Labs, F/U due to conflict in his schedule to 11/23

## 2020-06-23 ENCOUNTER — Other Ambulatory Visit: Payer: Self-pay | Admitting: Oncology

## 2020-06-23 DIAGNOSIS — C8319 Mantle cell lymphoma, extranodal and solid organ sites: Secondary | ICD-10-CM

## 2020-06-23 NOTE — Progress Notes (Addendum)
[facility]  [address] [place],  [place]  [address] [phone]  Clinic Day:  06/24/2020  Referring physician: No ref. provider found   HISTORY OF PRESENT ILLNESS:  The patient is a 59 y.o. 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  Of note, this patient has been dealing with mantle cell lymphoma since 2011.  He recently completed palliative radiation to his left parietal scalp lesion that was biopsy proven in July 2021 to be recurrent mantle cell lymphoma.  Earlier this year, the patient received radiation to his tonsillar/cervical regions as a PET scan done showed hypermetabolic disease in these areas.  Prior systemic therapies he has received over the past 10 years include R hyper-CVAD, BEAM chemotherapy followed by an autologous stem cell rescue, and bendamustine/Rituxan.  He comes in today to reassess his disease.  Since his last visit, the patient has been doing okay.  He worried about the fullness in the medial aspects of his right eye, which leads to excessive tearing.  His ocular surgeon saw him recently.  Per the patient's report, he was concerned some type of pathology is in the tear duct to where he recommended a biopsy of the area.  This has the patient somewhat hesitant, particularly as his right eye is the one he relies on the most for vision.  With respect to his mantle cell lymphoma, he denies having any new B symptoms, lymphadenopathy or other findings which concern him for overt signs of disease progression.  PHYSICAL EXAM:  Blood pressure (!) 150/80, pulse (!) 52, temperature 98.1 F (36.7 C), resp. rate 18, height 5\' 9"  (1.753 m), weight 209 lb 6.4 oz (95 kg), SpO2 96 %. Wt Readings from Last 3 Encounters:  06/24/20 209 lb 6.4 oz (95 kg)   Body mass index is 30.92 kg/m. Performance status (ECOG): 1 - Symptomatic but completely ambulatory Physical Exam Constitutional:      Appearance: Normal appearance. He is not ill-appearing.   HENT:     Mouth/Throat:     Mouth: Mucous membranes are moist.     Pharynx: Oropharynx is clear. No oropharyngeal exudate or posterior oropharyngeal erythema.  Eyes:     General:        Right eye: Foreign body (fullness is palpated in his lower eyelid, with  tissue covering the medial aspects of his right sclera) present.  Cardiovascular:     Rate and Rhythm: Normal rate and regular rhythm.     Heart sounds: No murmur heard.  No friction rub. No gallop.   Pulmonary:     Effort: Pulmonary effort is normal. No respiratory distress.     Breath sounds: Normal breath sounds. No wheezing, rhonchi or rales.  Abdominal:     General: Bowel sounds are normal. There is no distension.     Palpations: Abdomen is soft. There is no mass.     Tenderness: There is no abdominal tenderness.  Musculoskeletal:        General: No swelling.     Right lower leg: No edema.     Left lower leg: No edema.  Lymphadenopathy:     Cervical: No cervical adenopathy.     Upper Body:     Right upper body: No supraclavicular or axillary adenopathy.     Left upper body: No supraclavicular or axillary adenopathy.     Lower Body: No right inguinal adenopathy. No left inguinal adenopathy.  Skin:    General: Skin is warm.     Coloration:  Skin is not jaundiced.     Findings: No lesion or rash.  Neurological:     General: No focal deficit present.     Mental Status: He is alert and oriented to person, place, and time. Mental status is at baseline.     Cranial Nerves: Cranial nerves are intact.  Psychiatric:        Mood and Affect: Mood normal.        Behavior: Behavior normal.        Thought Content: Thought content normal.     LABS:   CBC Latest Ref Rng & Units 06/24/2020  WBC - 5.1  Hemoglobin 13.5 - 17.5 13.4(A)  Hematocrit 41 - 53 41  Platelets 150 - 399 284   CMP Latest Ref Rng & Units 06/24/2020  BUN 4 - 21 17  Creatinine 0.6 - 1.3 0.8  Sodium 137 - 147 139  Potassium 3.4 - 5.3 4.6  Chloride 99 -  108 107  CO2 13 - 22 26(A)  Calcium 8.7 - 10.7 9.0  Alkaline Phos 25 - 125 62  AST 14 - 40 25  ALT 10 - 40 13    ASSESSMENT & PLAN:   Assessment/Plan:  A 59 y.o. 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  Based upon his exam today, my concern is the fullness in the medial aspects of his right eye may be an atypical focus of recurrent lymphoma.  I understand why his ocular surgeon is concerned about this area.  As the patient wishes to avoid a biopsy in this area, I will order a PET scan to see if the area in question, as well as other sites, light up.  If so, it would strengthen the belief that his disease is definitely more systemic in nature again to where systemic, more so than local, therapy needs to be considered.  The patient understands he will likely be living with mantle cell lymphoma for the rest of his life.  The goal will be to use approved therapies to keep his disease under control for as long as possible.  His PET scan is tentatively scheduled for next week.  I will see him back the following day to go over the images and their implications.  The patient understands all the plans discussed today and is in agreement with them.    Mckenleigh Tarlton Macarthur Critchley, MD

## 2020-06-24 ENCOUNTER — Inpatient Hospital Stay: Payer: Medicare Other | Attending: Oncology

## 2020-06-24 ENCOUNTER — Inpatient Hospital Stay (INDEPENDENT_AMBULATORY_CARE_PROVIDER_SITE_OTHER): Payer: Medicare Other | Admitting: Oncology

## 2020-06-24 ENCOUNTER — Other Ambulatory Visit: Payer: Self-pay | Admitting: Hematology and Oncology

## 2020-06-24 ENCOUNTER — Encounter: Payer: Self-pay | Admitting: Oncology

## 2020-06-24 ENCOUNTER — Other Ambulatory Visit: Payer: Self-pay

## 2020-06-24 ENCOUNTER — Other Ambulatory Visit: Payer: Self-pay | Admitting: Oncology

## 2020-06-24 VITALS — BP 150/80 | HR 52 | Temp 98.1°F | Resp 18 | Ht 69.0 in | Wt 209.4 lb

## 2020-06-24 DIAGNOSIS — C8310 Mantle cell lymphoma, unspecified site: Secondary | ICD-10-CM | POA: Insufficient documentation

## 2020-06-24 DIAGNOSIS — C8319 Mantle cell lymphoma, extranodal and solid organ sites: Secondary | ICD-10-CM

## 2020-06-24 LAB — CBC AND DIFFERENTIAL
HCT: 41 (ref 41–53)
Hemoglobin: 13.4 — AB (ref 13.5–17.5)
Neutrophils Absolute: 2.55
Platelets: 284 (ref 150–399)
WBC: 5.1

## 2020-06-24 LAB — CBC: RBC: 4.11 (ref 3.87–5.11)

## 2020-06-24 LAB — BASIC METABOLIC PANEL
BUN: 17 (ref 4–21)
CO2: 26 — AB (ref 13–22)
Chloride: 107 (ref 99–108)
Creatinine: 0.8 (ref 0.6–1.3)
Glucose: 85
Potassium: 4.6 (ref 3.4–5.3)
Sodium: 139 (ref 137–147)

## 2020-06-24 LAB — HEPATIC FUNCTION PANEL
ALT: 13 (ref 10–40)
AST: 25 (ref 14–40)
Alkaline Phosphatase: 62 (ref 25–125)
Bilirubin, Total: 0.4

## 2020-06-24 LAB — COMPREHENSIVE METABOLIC PANEL
Albumin: 4.2 (ref 3.5–5.0)
Calcium: 9 (ref 8.7–10.7)

## 2020-06-24 LAB — LACTATE DEHYDROGENASE: LDH: 137 U/L (ref 98–192)

## 2020-07-02 NOTE — Progress Notes (Deleted)
[facility]  [address] [place],  [place]  [address] [phone]  Clinic Day:  07/02/2020  Referring physician: No ref. provider found   HISTORY OF PRESENT ILLNESS:  The patient is a 59 y.o. 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  He comes in today to go over his PET scan to determine if he has radiographic evidence of diffuse disease.  In particular, he has had fullness in his right tear duct area for which it was recommended that this area be biopsied for further evaluation. The patient has been somewhat hesitant about getting it done, particularly as his right eye is the one he relies on the most for his vision. Since his last visit, the patient has been doing okay.  With respect to his mantle cell lymphoma, he denies having any new B symptoms, lymphadenopathy or other findings which concern him for overt signs of disease progression.Of note, this patient has been dealing with mantle cell lymphoma since 2011.  He recently completed palliative radiation to his left parietal scalp lesion that was biopsy proven in July 2021 to be recurrent mantle cell lymphoma.  Earlier this year, the patient received radiation to his tonsillar/cervical regions as a PET scan done showed hypermetabolic disease in these areas.  Prior systemic therapies he has received over the past 10 years include R hyper-CVAD, BEAM chemotherapy followed by an autologous stem cell rescue, and bendamustine/Rituxan.   PHYSICAL EXAM:  There were no vitals taken for this visit. Wt Readings from Last 3 Encounters:  06/24/20 209 lb 6.4 oz (95 kg)   There is no height or weight on file to calculate BMI. Performance status (ECOG): 1 - Symptomatic but completely ambulatory Physical Exam Constitutional:      Appearance: Normal appearance. He is not ill-appearing.  HENT:     Mouth/Throat:     Mouth: Mucous membranes are moist.     Pharynx: Oropharynx is clear. No oropharyngeal exudate or  posterior oropharyngeal erythema.  Eyes:     General:        Right eye: Foreign body (fullness is palpated in his lower eyelid, with  tissue covering the medial aspects of his right sclera) present.  Cardiovascular:     Rate and Rhythm: Normal rate and regular rhythm.     Heart sounds: No murmur heard.  No friction rub. No gallop.   Pulmonary:     Effort: Pulmonary effort is normal. No respiratory distress.     Breath sounds: Normal breath sounds. No wheezing, rhonchi or rales.  Abdominal:     General: Bowel sounds are normal. There is no distension.     Palpations: Abdomen is soft. There is no mass.     Tenderness: There is no abdominal tenderness.  Musculoskeletal:        General: No swelling.     Right lower leg: No edema.     Left lower leg: No edema.  Lymphadenopathy:     Cervical: No cervical adenopathy.     Upper Body:     Right upper body: No supraclavicular or axillary adenopathy.     Left upper body: No supraclavicular or axillary adenopathy.     Lower Body: No right inguinal adenopathy. No left inguinal adenopathy.  Skin:    General: Skin is warm.     Coloration: Skin is not jaundiced.     Findings: No lesion or rash.  Neurological:     General: No focal deficit present.     Mental  Status: He is alert and oriented to person, place, and time. Mental status is at baseline.     Cranial Nerves: Cranial nerves are intact.  Psychiatric:        Mood and Affect: Mood normal.        Behavior: Behavior normal.        Thought Content: Thought content normal.    SCANS:  His PET scan done yesterday revealed the following: FINDINGS: Mediastinal blood pool activity: SUV max 2.09  Liver activity: SUV max 2.94  NECK: Within the medial aspect of the right orbit there is a FDG avid soft tissue mass measuring 3 x 1.2 cm with an SUV max of 5.4, image 56/3.  Multiple prominent lymph nodes are identified within and around the left parotid gland. The largest is identified within  the substance of the parotid gland measuring 1.6 x 1.2 cm and has an SUV max of 3.66, image 58/3.  Left, pre auricular soft tissue nodule measures 1.7 x 1.3 cm and has an SUV max of 3.66, image 47/3.Marland Kitchen  Asymmetric increased uptake within the right tonsil region has an SUV max of 6.85.  Left level 2 lymph node measures 1.1 cm within SUV max of 4.18, image 85/3.  Left level 3 lymph node measures 0.8 cm within SUV max of 3.43, image 95/3  Left level 5 node measures 0.9 cm and has an SUV max of 2.8, image 92/3.Marland Kitchen  Incidental CT findings: none  CHEST: No FDG avid axillary, supraclavicular, mediastinal, or hilar lymph nodes.  No FDG avid lung nodules.  Incidental CT findings: none  ABDOMEN/PELVIS: No abnormal hypermetabolic activity within the liver, pancreas, or adrenal glands. No hypermetabolic lymph nodes in the abdomen or pelvis.  Incidental CT findings: Aortic atherosclerosis noted. Previous splenectomy.  SKELETON: No focal hypermetabolic activity to suggest skeletal metastasis.  Incidental CT findings: none  IMPRESSION: 1. FDG avid soft tissue lesion within the medial aspect of the right orbit is identified concerning for tumor. 2. Asymmetric enlarged left parotid gland and left pre auricular lymph nodes concerning for metabolically active tumor 3. Borderline enlarged left level 2 and level 3 lymph nodes, also concerning for sites of disease. Borderline enlarged left level 5 node with mild FDG uptake is equivocal for lymphoma. 4. No FDG avid tumor identified within the chest, abdomen or pelvis. 5. Aortic Atherosclerosis (ICD10-I70.0).  ASSESSMENT & PLAN:   Assessment/Plan:  A 59 y.o. 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  Based upon his PET scan results, I am very convinced the fullness is his right orbit represents another focus of mantle cell lymphoma.  He has already had it in his tonsillar region and his scalp.  As his mantle cell lymphoma involves multiple  areas around his face and neck, I believe he now warrants systemic therapy.  As he has never been exposed to BTK inhibitor therapy, I will start him on ibrutinib 560 mg today, for which he already has a month's sample.  He is aware of the side effects that can go along with ibrutinib, including arthagias, bleeding complications and atrial fibrillation.  I will see him back in 1 month to reassess his disease to see how well he is responding to ibrutinib.   The patient understands all the plans discussed today and is in agreement with them.    Thirza Pellicano Macarthur Critchley, MD

## 2020-07-03 ENCOUNTER — Inpatient Hospital Stay: Payer: Medicare Other | Attending: Oncology | Admitting: Oncology

## 2020-07-08 ENCOUNTER — Ambulatory Visit: Payer: Medicare Other | Admitting: Oncology

## 2020-07-08 ENCOUNTER — Other Ambulatory Visit: Payer: Medicare Other

## 2020-07-11 DIAGNOSIS — Z20822 Contact with and (suspected) exposure to covid-19: Principal | ICD-10-CM

## 2020-07-11 DIAGNOSIS — Z01812 Encounter for preprocedural laboratory examination: Principal | ICD-10-CM

## 2020-07-14 ENCOUNTER — Ambulatory Visit: Admit: 2020-07-14 | Discharge: 2020-07-15 | Payer: MEDICARE

## 2020-07-14 DIAGNOSIS — Z20822 Contact with and (suspected) exposure to covid-19: Principal | ICD-10-CM

## 2020-07-14 DIAGNOSIS — Z01812 Encounter for preprocedural laboratory examination: Principal | ICD-10-CM

## 2020-07-15 DIAGNOSIS — H0589 Other disorders of orbit: Principal | ICD-10-CM

## 2020-07-15 DIAGNOSIS — Z9481 Bone marrow transplant status: Principal | ICD-10-CM

## 2020-07-15 DIAGNOSIS — Z803 Family history of malignant neoplasm of breast: Principal | ICD-10-CM

## 2020-07-15 DIAGNOSIS — Z79899 Other long term (current) drug therapy: Principal | ICD-10-CM

## 2020-07-15 DIAGNOSIS — F17210 Nicotine dependence, cigarettes, uncomplicated: Principal | ICD-10-CM

## 2020-07-15 DIAGNOSIS — Z9049 Acquired absence of other specified parts of digestive tract: Principal | ICD-10-CM

## 2020-07-15 DIAGNOSIS — C8311 Mantle cell lymphoma, lymph nodes of head, face, and neck: Principal | ICD-10-CM

## 2020-07-15 DIAGNOSIS — Z9081 Acquired absence of spleen: Principal | ICD-10-CM

## 2020-07-15 DIAGNOSIS — H04201 Unspecified epiphora, right lacrimal gland: Principal | ICD-10-CM

## 2020-07-16 ENCOUNTER — Ambulatory Visit: Admit: 2020-07-16 | Discharge: 2020-07-16 | Payer: MEDICARE

## 2020-07-16 ENCOUNTER — Encounter
Admit: 2020-07-16 | Discharge: 2020-07-16 | Payer: MEDICARE | Attending: Student in an Organized Health Care Education/Training Program | Primary: Student in an Organized Health Care Education/Training Program

## 2020-07-16 DIAGNOSIS — Z9481 Bone marrow transplant status: Principal | ICD-10-CM

## 2020-07-16 DIAGNOSIS — Z803 Family history of malignant neoplasm of breast: Principal | ICD-10-CM

## 2020-07-16 DIAGNOSIS — C8311 Mantle cell lymphoma, lymph nodes of head, face, and neck: Principal | ICD-10-CM

## 2020-07-16 DIAGNOSIS — F17210 Nicotine dependence, cigarettes, uncomplicated: Principal | ICD-10-CM

## 2020-07-16 DIAGNOSIS — H04201 Unspecified epiphora, right lacrimal gland: Principal | ICD-10-CM

## 2020-07-16 DIAGNOSIS — H0589 Other disorders of orbit: Principal | ICD-10-CM

## 2020-07-16 DIAGNOSIS — Z9081 Acquired absence of spleen: Principal | ICD-10-CM

## 2020-07-16 DIAGNOSIS — Z79899 Other long term (current) drug therapy: Principal | ICD-10-CM

## 2020-07-16 DIAGNOSIS — Z9049 Acquired absence of other specified parts of digestive tract: Principal | ICD-10-CM

## 2020-07-16 MED ORDER — NEOMYCIN-POLYMYXIN-DEXAMETH 3.5 MG/ML-10,000 UNIT/ML-0.1% EYE DROPS
Freq: Four times a day (QID) | OPHTHALMIC | 0 refills | 25 days | Status: CP
Start: 2020-07-16 — End: 2020-08-10
  Filled 2020-07-16: qty 5, 25d supply, fill #0

## 2020-07-16 MED FILL — NEOMYCIN-POLYMYXIN-DEXAMETH 3.5 MG/ML-10,000 UNIT/ML-0.1% EYE DROPS: 25 days supply | Qty: 5 | Fill #0 | Status: AC

## 2020-07-31 ENCOUNTER — Ambulatory Visit: Admit: 2020-07-31 | Discharge: 2020-08-01 | Payer: MEDICARE

## 2020-07-31 DIAGNOSIS — Z9889 Other specified postprocedural states: Principal | ICD-10-CM

## 2020-07-31 DIAGNOSIS — H04201 Unspecified epiphora, right lacrimal gland: Principal | ICD-10-CM

## 2020-07-31 DIAGNOSIS — H0589 Other disorders of orbit: Principal | ICD-10-CM

## 2020-08-03 NOTE — Progress Notes (Unsigned)
[facility]  [address] [place],  [place]  [id number] [id number]  Clinic Day:  08/04/2020  Referring physician: No ref. provider found   HISTORY OF PRESENT ILLNESS:  The patient is a 60 y.o. 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  Of note, this patient has been dealing with mantle cell lymphoma since 2011.  He recently completed palliative radiation to a left parietal scalp lesion that was biopsy proven in July 2021 to be recurrent mantle cell lymphoma.  In early 2021, the patient received radiation to his tonsillar/cervical regions as a PET scan done showed hypermetabolic disease in these areas.  Prior systemic therapies he has received over the past 10 years have included R hyper-CVAD, BEAM chemotherapy followed by an autologous stem cell rescue, and bendamustine/Rituxan.  He comes in today to go over his PET scan done recently as physical exam findings were worrisome for systemic disease progression, including right lower eyelid fullness.  Since his last visit, the patient underwent a right orbitotomy, whose pathology unsurprisingly came back consistent with mantle cell lymphoma.  Although he recognizes this lesion in the medial crevice of his right eye, it does not cause him any particular problems.  He denies having any B symptoms, but has left preauricular nodal fullness.  PHYSICAL EXAM:  Blood pressure 132/71, pulse 60, temperature 98.1 F (36.7 C), resp. rate 18, height 5\' 9"  (1.753 m), weight 211 lb 11.2 oz (96 kg), SpO2 95 %. Wt Readings from Last 3 Encounters:  08/04/20 211 lb 11.2 oz (96 kg)  06/24/20 209 lb 6.4 oz (95 kg)   Body mass index is 31.26 kg/m. Performance status (ECOG): 0 Physical Exam Constitutional:      General: He is not in acute distress.    Appearance: Normal appearance. He is normal weight. He is not ill-appearing.  HENT:     Mouth/Throat:     Mouth: Mucous membranes are moist.     Pharynx: Oropharynx is clear. No  oropharyngeal exudate or posterior oropharyngeal erythema.  Eyes:     General:        Right eye: Foreign body (fullness is palpated in his lower eyelid, with  tissue covering the medial aspects of his right sclera) present.  Cardiovascular:     Rate and Rhythm: Normal rate and regular rhythm.     Heart sounds: No murmur heard. No friction rub. No gallop.   Pulmonary:     Effort: Pulmonary effort is normal. No respiratory distress.     Breath sounds: Normal breath sounds. No wheezing, rhonchi or rales.  Chest:  Breasts:     Right: No axillary adenopathy or supraclavicular adenopathy.     Left: No axillary adenopathy or supraclavicular adenopathy (left preauricalar lympadenopathy spans 4-5 cm).    Abdominal:     General: Bowel sounds are normal. There is no distension.     Palpations: Abdomen is soft. There is no mass.     Tenderness: There is no abdominal tenderness.  Musculoskeletal:        General: No swelling.     Right lower leg: No edema.     Left lower leg: No edema.  Lymphadenopathy:     Cervical: No cervical adenopathy.     Upper Body:     Right upper body: No supraclavicular or axillary adenopathy.     Left upper body: No supraclavicular (left preauricalar lympadenopathy spans 4-5 cm) or axillary adenopathy.     Lower Body: No right inguinal adenopathy. No  left inguinal adenopathy.  Skin:    General: Skin is warm.     Coloration: Skin is not jaundiced.     Findings: No lesion or rash.  Neurological:     General: No focal deficit present.     Mental Status: He is alert and oriented to person, place, and time. Mental status is at baseline.     Cranial Nerves: Cranial nerves are intact. No cranial nerve deficit.     Motor: No weakness.  Psychiatric:        Mood and Affect: Mood normal.        Behavior: Behavior normal.        Thought Content: Thought content normal.   SCANS: His PET scan done in early December 2021 revealed the following: FINDINGS: Mediastinal  blood pool activity: SUV max 2.09  Liver activity: SUV max 2.94  NECK: Within the medial aspect of the right orbit there is a FDG avid soft tissue mass measuring 3 x 1.2 cm with an SUV max of 5.4, image 56/3.  Multiple prominent lymph nodes are identified within and around the left parotid gland. The largest is identified within the substance of the parotid gland measuring 1.6 x 1.2 cm and has an SUV max of 3.66, image 58/3.  Left, pre auricular soft tissue nodule measures 1.7 x 1.3 cm and has an SUV max of 3.66, image 47/3.Marland Kitchen  Asymmetric increased uptake within the right tonsil region has an SUV max of 6.85.  Left level 2 lymph node measures 1.1 cm within SUV max of 4.18, image 85/3.  Left level 3 lymph node measures 0.8 cm within SUV max of 3.43, image 95/3  Left level 5 node measures 0.9 cm and has an SUV max of 2.8, image 92/3.Marland Kitchen  Incidental CT findings: none  CHEST: No FDG avid axillary, supraclavicular, mediastinal, or hilar lymph nodes.  No FDG avid lung nodules.  Incidental CT findings: none  ABDOMEN/PELVIS: No abnormal hypermetabolic activity within the liver, pancreas, or adrenal glands. No hypermetabolic lymph nodes in the abdomen or pelvis.  Incidental CT findings: Aortic atherosclerosis noted. Previous splenectomy.  SKELETON: No focal hypermetabolic activity to suggest skeletal metastasis.  Incidental CT findings: none  IMPRESSION: 1. FDG avid soft tissue lesion within the medial aspect of the right orbit is identified concerning for tumor. 2. Asymmetric enlarged left parotid gland and left pre auricular lymph nodes concerning for metabolically active tumor 3. Borderline enlarged left level 2 and level 3 lymph nodes, also concerning for sites of disease. Borderline enlarged left level 5 node with mild FDG uptake is equivocal for lymphoma. 4. No FDG avid tumor identified within the chest, abdomen or pelvis. 5. Aortic Atherosclerosis  (ICD10-I70.0).  LABS:     ASSESSMENT & PLAN:  Assessment/Plan:  A 60 y.o. male with recurrent mantle cell lymphoma, which includes biopsy-proven evidence of right lower eyelid involvement.  In clinic today, I went over his biopsy results and recent PET scan images with him, for which he clearly sees he has systemic involvement of his mantle cell lymphoma.  Based upon this, the plan will be to start this gentleman on ibrutinib 560 mg.  Of note, he has never been previously exposed to any BTK therapy.  The patient was convinced that Rituxan needed to be added to his ibrutinib therapy.  However, this gentleman has been exposed to this agent twice in the past.  I explained to him that because of this, his disease will likely not respond much at all  to this agent for a 3rd time.  I emphasized to him how efficacious ibrutinib is by itself in mantle cell lymphoma.  He was made aware of the side effects that can potentially go along with ibrutinib, including bleeding, atrial fibrillation and arthralgias.  As he has this medication at home already, he knows to start it immediately.  I will see him back in 1 month to see how well he is responding to his ibrutinib therapy.  The patient understands all the plans discussed today and is in agreement with them.    Anamaria Dusenbury Macarthur Critchley, MD

## 2020-08-04 ENCOUNTER — Telehealth: Payer: Self-pay | Admitting: Oncology

## 2020-08-04 ENCOUNTER — Inpatient Hospital Stay (INDEPENDENT_AMBULATORY_CARE_PROVIDER_SITE_OTHER): Payer: Medicare Other | Admitting: Oncology

## 2020-08-04 ENCOUNTER — Inpatient Hospital Stay: Payer: Medicare Other | Attending: Oncology

## 2020-08-04 ENCOUNTER — Other Ambulatory Visit: Payer: Self-pay | Admitting: Hematology and Oncology

## 2020-08-04 ENCOUNTER — Other Ambulatory Visit: Payer: Self-pay | Admitting: Oncology

## 2020-08-04 ENCOUNTER — Other Ambulatory Visit: Payer: Self-pay

## 2020-08-04 VITALS — BP 132/71 | HR 60 | Temp 98.1°F | Resp 18 | Ht 69.0 in | Wt 211.7 lb

## 2020-08-04 DIAGNOSIS — Z5112 Encounter for antineoplastic immunotherapy: Secondary | ICD-10-CM | POA: Insufficient documentation

## 2020-08-04 DIAGNOSIS — C8319 Mantle cell lymphoma, extranodal and solid organ sites: Secondary | ICD-10-CM

## 2020-08-04 DIAGNOSIS — C8310 Mantle cell lymphoma, unspecified site: Secondary | ICD-10-CM | POA: Insufficient documentation

## 2020-08-04 LAB — CBC AND DIFFERENTIAL
HCT: 40 — AB (ref 41–53)
Hemoglobin: 13.8 (ref 13.5–17.5)
Neutrophils Absolute: 2.02
Platelets: 249 (ref 150–399)
WBC: 4.6

## 2020-08-04 LAB — BASIC METABOLIC PANEL
BUN: 11 (ref 4–21)
CO2: 23 — AB (ref 13–22)
Chloride: 108 (ref 99–108)
Creatinine: 0.9 (ref 0.6–1.3)
Glucose: 96
Potassium: 4.6 (ref 3.4–5.3)
Sodium: 138 (ref 137–147)

## 2020-08-04 LAB — COMPREHENSIVE METABOLIC PANEL
Albumin: 3.9 (ref 3.5–5.0)
Calcium: 8.8 (ref 8.7–10.7)

## 2020-08-04 LAB — HEPATIC FUNCTION PANEL
ALT: 19 (ref 10–40)
AST: 22 (ref 14–40)
Alkaline Phosphatase: 57 (ref 25–125)
Bilirubin, Total: 0.3

## 2020-08-04 LAB — CBC: RBC: 4.06 (ref 3.87–5.11)

## 2020-08-04 NOTE — Telephone Encounter (Signed)
Per 1/3 los next appt given to patient 

## 2020-08-05 ENCOUNTER — Inpatient Hospital Stay (INDEPENDENT_AMBULATORY_CARE_PROVIDER_SITE_OTHER): Payer: Medicare Other | Admitting: Hematology and Oncology

## 2020-08-05 DIAGNOSIS — C8319 Mantle cell lymphoma, extranodal and solid organ sites: Secondary | ICD-10-CM

## 2020-08-05 MED ORDER — PROCHLORPERAZINE MALEATE 10 MG PO TABS: 10.0000 mg | ORAL_TABLET | Freq: Four times a day (QID) | ORAL | 3 refills | Status: DC | PRN

## 2020-08-05 MED ORDER — ONDANSETRON HCL 4 MG PO TABS: 4.0000 mg | ORAL_TABLET | ORAL | 3 refills | Status: DC | PRN

## 2020-08-05 NOTE — Progress Notes (Signed)
The patient is a 59 year old male with recurrent mantle cell lymphoma.  Patient presents to clinic today for chemotherapy education and palliative care consult.  We will start ibrutinib and rituxan once authorization has been obtained.  We will send in prescriptions for prochlorperazine and ondansetron.  The patient verbalizes understanding of and agreement to the plan as discussed today.  Provided general information including the following: 1.  Date of education: 08-05-2020 2.  Physician name: Dr. Lewis 3.  Diagnosis: Mantle Cell Lymphoma 4.  Stage: Recurrent 5.  Curative or palliative 6.  Chemotherapy plan including drugs and how often: Ibrutinib, Rituximab IV weekly x 4 doses and then every 4 weeks 7.  Start date: 08-07-2020 8.  Other referrals: No other referrals at this time. 9.  The patient is to call our office with any questions or concerns.  Our office number [phone], if after hours or on the weekend, call the same number and wait for the answering service.  There is always an oncologist on call 10.  Medications prescribed: Zofran and Compazine 11.  The patient has verbalized understanding of the treatment plan and has no barriers to adherence or understanding.  Obtained signed consent from patient.  Discussed symptoms including 1.  Low blood counts including red blood cells, white blood cells and platelets. 2. Infection including to avoid large crowds, wash hands frequently, and stay away from people who were sick.  If fever develops of 100.4 or higher, call our office. 3.  Mucositis-given instructions on mouth rinse (baking soda and salt mixture).  Keep mouth clean.  Use soft bristle toothbrush.  If mouth sores develop, call our clinic. 4.  Nausea/vomiting-gave prescriptions for ondansetron 4 mg every 4 hours as needed for nausea, may take around the clock if persistent.  Compazine 10 mg every 6 hours, may take around the clock if persistent. 5.  Diarrhea-use over-the-counter  Imodium.  Call clinic if not controlled. 6.  Constipation-use senna, 1 to 2 tablets twice a day.  If no BM in 2 to 3 days call the clinic. 7.  Loss of appetite-try to eat small meals every 2-3 hours.  Call clinic if not eating. 8.  Taste changes-zinc 500 mg daily.  If becomes severe call clinic. 9.  Alcoholic beverages. 10.  Drink 2 to 3 quarts of water per day. 11.  Peripheral neuropathy-patient to call if numbness or tingling in hands or feet is persistent   Gave information on the supportive care team and how to contact them regarding services.  Discussed advanced directives.  The patient does not have their advanced directives but will look at the copy provided in their notebook and will call with any questions. [employer] Social worker Advanced directives  Answered questions to patient satisfaction.  Patient is to call with any further questions or concerns.  Time spent on this palliative care/chemotherapy education was 60 minutes with more than 50% spent discussing diagnosis, prognosis and symptom management.  The medication prescribed to the patient will be printed out from [email or web address] This will give the following information: Name of your medication Approved uses Dose and schedule Storage and handling Handling body fluids and waste Drug and food interactions Possible side effects and management Pregnancy, sexual activity, and contraception Obtaining medication 

## 2020-08-06 ENCOUNTER — Telehealth: Payer: Self-pay | Admitting: Oncology

## 2020-08-06 ENCOUNTER — Other Ambulatory Visit: Payer: Self-pay | Admitting: Oncology

## 2020-08-06 NOTE — Telephone Encounter (Signed)
1/3 all appts sched and given to patient

## 2020-08-07 ENCOUNTER — Other Ambulatory Visit: Payer: Self-pay

## 2020-08-07 ENCOUNTER — Inpatient Hospital Stay: Payer: Medicare Other

## 2020-08-07 VITALS — BP 113/64 | HR 71 | Temp 98.7°F | Resp 20 | Ht 69.0 in | Wt 211.0 lb

## 2020-08-07 DIAGNOSIS — Z5112 Encounter for antineoplastic immunotherapy: Secondary | ICD-10-CM | POA: Diagnosis not present

## 2020-08-07 DIAGNOSIS — C8310 Mantle cell lymphoma, unspecified site: Secondary | ICD-10-CM

## 2020-08-07 MED ORDER — ACETAMINOPHEN 325 MG PO TABS
ORAL_TABLET | ORAL | Status: AC
  Filled 2020-08-07: qty 2

## 2020-08-07 MED ORDER — RITUXIMAB-PVVR CHEMO 500 MG/50ML IV SOLN
375.0000 mg/m2 | Freq: Once | INTRAVENOUS | Status: AC
  Administered 2020-08-07: 800 mg via INTRAVENOUS
  Filled 2020-08-07: qty 50

## 2020-08-07 MED ORDER — SODIUM CHLORIDE 0.9 % IV SOLN
Freq: Once | INTRAVENOUS | Status: AC
  Filled 2020-08-07: qty 250

## 2020-08-07 MED ORDER — DIPHENHYDRAMINE HCL 25 MG PO CAPS
50.0000 mg | ORAL_CAPSULE | Freq: Once | ORAL | Status: AC
  Administered 2020-08-07: 50 mg via ORAL

## 2020-08-07 MED ORDER — DIPHENHYDRAMINE HCL 25 MG PO CAPS
ORAL_CAPSULE | ORAL | Status: AC
  Filled 2020-08-07: qty 2

## 2020-08-07 MED ORDER — ACETAMINOPHEN 325 MG PO TABS
650.0000 mg | ORAL_TABLET | Freq: Once | ORAL | Status: AC
  Administered 2020-08-07: 650 mg via ORAL

## 2020-08-07 MED ORDER — SODIUM CHLORIDE 0.9% FLUSH
10.0000 mL | INTRAVENOUS | Status: DC | PRN
  Filled 2020-08-07: qty 10

## 2020-08-07 NOTE — Patient Instructions (Signed)
[facility] Discharge Instructions for Patients Receiving Chemotherapy  Today you received the following chemotherapy agents Rituximab ,Ibrutinib  To help prevent nausea and vomiting after your treatment, we encourage you to take your nausea medication as directed.   If you develop nausea and vomiting that is not controlled by your nausea medication, call the clinic.   BELOW ARE SYMPTOMS THAT SHOULD BE REPORTED IMMEDIATELY:  *FEVER GREATER THAN 100.5 F  *CHILLS WITH OR WITHOUT FEVER  NAUSEA AND VOMITING THAT IS NOT CONTROLLED WITH YOUR NAUSEA MEDICATION  *UNUSUAL SHORTNESS OF BREATH  *UNUSUAL BRUISING OR BLEEDING  TENDERNESS IN MOUTH AND THROAT WITH OR WITHOUT PRESENCE OF ULCERS  *URINARY PROBLEMS  *BOWEL PROBLEMS  UNUSUAL RASH Items with * indicate a potential emergency and should be followed up as soon as possible.  Feel free to call the clinic should you have any questions or concerns at The clinic phone number is [phone].  Please show the CHEMO ALERT CARD at check-in to the Emergency Department and triage nurse.

## 2020-08-07 NOTE — Progress Notes (Signed)
Patient will get his Ibrutinib from [facility] today, he has a co-pay of $9.85.

## 2020-08-07 NOTE — Progress Notes (Signed)
1515: PT STABLE AT TIME OF DISCHARGE  

## 2020-08-08 ENCOUNTER — Encounter: Payer: Self-pay | Admitting: General Practice

## 2020-08-08 NOTE — Progress Notes (Signed)
[facility] CSW Progress Notes  Call from patient - he is trying to get help applying for [employer] Urgent NEed program - needs medical personnel to submit application on his behalf.  Messaged CSW Consuello Masse, asked her to reach out to him.  Called patient back, gave patient name of his assigned CSW.  Edwyna Shell, LCSW Clinical Social Worker Phone:  [phone]

## 2020-08-11 ENCOUNTER — Telehealth: Payer: Self-pay

## 2020-08-11 ENCOUNTER — Other Ambulatory Visit: Payer: Self-pay | Admitting: Hematology and Oncology

## 2020-08-11 ENCOUNTER — Encounter: Payer: Self-pay | Admitting: Oncology

## 2020-08-11 DIAGNOSIS — C8310 Mantle cell lymphoma, unspecified site: Secondary | ICD-10-CM

## 2020-08-11 NOTE — Progress Notes (Signed)
Samuel Crane called in asking about applying for the Urgent Need Fund through [place]. I called and spoke with LLS and this fund closed on Friday, 08/08/20. Called to let Samuel Crane know and to ask him about applying for the [employer] through [place]. We will plan on meeting 08/14/20@11 :30am.

## 2020-08-11 NOTE — Telephone Encounter (Signed)
I called & spoke with pt regarding his Rituxan infusion last week. Pt states he tolerated infusion without any problems. He only had 1 episode in the middle of the night after infusion, in which he had to take a pill for nausea. No rashes. No diarrhea.  He started his Imbruvica 560mg  capsule on 08/08/2019. Pt is taking the capsule every morning @ 8am with glass of water, on empty stomach. He denies any new skin rashes, N/V, and diarrhea since starting the Imbruvica. Pt understands to call the cancer center anytime his temp is over 100.4 day or night, & that we have an answering service who will notify the physician on call. I reminded pt that if he forgot to take his 8am dose, to take the Imbruvica as soon as he remembered it. He states he keeps his Imbruvica package right beside his coffee pot, as those are two things he knows he has to have every morning. Pt notified to call us with any questions/concerns.

## 2020-08-13 NOTE — Progress Notes (Signed)
Date and time changed to meet with patient regarding [employer]. We will now plan on meeting 08/19/2020@1 :30pm.

## 2020-08-14 ENCOUNTER — Other Ambulatory Visit: Payer: Self-pay

## 2020-08-14 ENCOUNTER — Inpatient Hospital Stay: Payer: Medicare Other

## 2020-08-14 VITALS — BP 131/74 | HR 61 | Temp 98.2°F | Resp 18 | Ht 69.0 in | Wt 216.8 lb

## 2020-08-14 DIAGNOSIS — C8310 Mantle cell lymphoma, unspecified site: Secondary | ICD-10-CM

## 2020-08-14 DIAGNOSIS — Z5112 Encounter for antineoplastic immunotherapy: Secondary | ICD-10-CM | POA: Diagnosis not present

## 2020-08-14 MED ORDER — DIPHENHYDRAMINE HCL 25 MG PO CAPS
ORAL_CAPSULE | ORAL | Status: AC
  Filled 2020-08-14: qty 2

## 2020-08-14 MED ORDER — ACETAMINOPHEN 325 MG PO TABS
ORAL_TABLET | ORAL | Status: AC
  Filled 2020-08-14: qty 2

## 2020-08-14 MED ORDER — ACETAMINOPHEN 325 MG PO TABS
650.0000 mg | ORAL_TABLET | Freq: Once | ORAL | Status: AC
Start: 2020-08-14 — End: 2020-08-14
  Administered 2020-08-14: 650 mg via ORAL

## 2020-08-14 MED ORDER — DIPHENHYDRAMINE HCL 25 MG PO CAPS
50.0000 mg | ORAL_CAPSULE | Freq: Once | ORAL | Status: AC
  Administered 2020-08-14: 50 mg via ORAL

## 2020-08-14 MED ORDER — SODIUM CHLORIDE 0.9 % IV SOLN
375.0000 mg/m2 | Freq: Once | INTRAVENOUS | Status: AC
  Administered 2020-08-14: 800 mg via INTRAVENOUS
  Filled 2020-08-14: qty 50

## 2020-08-14 MED ORDER — SODIUM CHLORIDE 0.9 % IV SOLN
Freq: Once | INTRAVENOUS | Status: AC
  Filled 2020-08-14: qty 250

## 2020-08-14 NOTE — Patient Instructions (Signed)
Rituximab Injection What is this medicine? RITUXIMAB (ri TUX i mab) is a monoclonal antibody. It is used to treat certain types of cancer like non-Hodgkin lymphoma and chronic lymphocytic leukemia. It is also used to treat rheumatoid arthritis, granulomatosis with polyangiitis, microscopic polyangiitis, and pemphigus vulgaris. This medicine may be used for other purposes; ask your health care provider or pharmacist if you have questions. COMMON BRAND NAME(S): RIABNI, Rituxan, RUXIENCE What should I tell my health care provider before I take this medicine? They need to know if you have any of these conditions:  chest pain  heart disease  infection especially a viral infection such as chickenpox, cold sores, hepatitis B, or herpes  immune system problems  irregular heartbeat or rhythm  kidney disease  low blood counts (white cells, platelets, or red cells)  lung disease  recent or upcoming vaccine  an unusual or allergic reaction to rituximab, other medicines, foods, dyes, or preservatives  pregnant or trying to get pregnant  breast-feeding How should I use this medicine? This medicine is injected into a vein. It is given by a health care provider in a hospital or clinic setting. A special MedGuide will be given to you before each treatment. Be sure to read this information carefully each time. Talk to your health care provider about the use of this medicine in children. While this drug may be prescribed for children as young as 2 years for selected conditions, precautions do apply. Overdosage: If you think you have taken too much of this medicine contact a poison control center or emergency room at once. NOTE: This medicine is only for you. Do not share this medicine with others. What if I miss a dose? Keep appointments for follow-up doses. It is important not to miss your dose. Call your health care provider if you are unable to keep an appointment. What may interact with this  medicine? Do not take this medicine with any of the following medicines:  live vaccines This medicine may also interact with the following medicines:  cisplatin This list may not describe all possible interactions. Give your health care provider a list of all the medicines, herbs, non-prescription drugs, or dietary supplements you use. Also tell them if you smoke, drink alcohol, or use illegal drugs. Some items may interact with your medicine. What should I watch for while using this medicine? Your condition will be monitored carefully while you are receiving this medicine. You may need blood work done while you are taking this medicine. This medicine can cause serious infusion reactions. To reduce the risk your health care provider may give you other medicines to take before receiving this one. Be sure to follow the directions from your health care provider. This medicine may increase your risk of getting an infection. Call your health care provider for advice if you get a fever, chills, sore throat, or other symptoms of a cold or flu. Do not treat yourself. Try to avoid being around people who are sick. Call your health care provider if you are around anyone with measles, chickenpox, or if you develop sores or blisters that do not heal properly. Avoid taking medicines that contain aspirin, acetaminophen, ibuprofen, naproxen, or ketoprofen unless instructed by your health care provider. These medicines may hide a fever. This medicine may cause serious skin reactions. They can happen weeks to months after starting the medicine. Contact your health care provider right away if you notice fevers or flu-like symptoms with a rash. The rash may be red   or purple and then turn into blisters or peeling of the skin. Or, you might notice a red rash with swelling of the face, lips or lymph nodes in your neck or under your arms. In some patients, this medicine may cause a serious brain infection that may cause  death. If you have any problems seeing, thinking, speaking, walking, or standing, tell your healthcare professional right away. If you cannot reach your healthcare professional, urgently seek other source of medical care. Do not become pregnant while taking this medicine or for at least 12 months after stopping it. Women should inform their health care provider if they wish to become pregnant or think they might be pregnant. There is potential for serious harm to an unborn child. Talk to your health care provider for more information. Women should use a reliable form of birth control while taking this medicine and for 12 months after stopping it. Do not breast-feed while taking this medicine or for at least 6 months after stopping it. What side effects may I notice from receiving this medicine? Side effects that you should report to your health care provider as soon as possible:  allergic reactions (skin rash, itching or hives; swelling of the face, lips, or tongue)  diarrhea  edema (sudden weight gain; swelling of the ankles, feet, hands or other unusual swelling; trouble breathing)  fast, irregular heartbeat  heart attack (trouble breathing; pain or tightness in the chest, neck, back or arms; unusually weak or tired)  infection (fever, chills, cough, sore throat, pain or trouble passing urine)  kidney injury (trouble passing urine or change in the amount of urine)  liver injury (dark yellow or brown urine; general ill feeling or flu-like symptoms; loss of appetite, right upper belly pain; unusually weak or tired, yellowing of the eyes or skin)  low blood pressure (dizziness; feeling faint or lightheaded, falls; unusually weak or tired)  low red blood cell counts (trouble breathing; feeling faint; lightheaded, falls; unusually weak or tired)  mouth sores  redness, blistering, peeling, or loosening of the skin, including inside the mouth  stomach pain  unusual bruising or  bleeding  wheezing (trouble breathing with loud or whistling sounds)  vomiting Side effects that usually do not require medical attention (report to your health care provider if they continue or are bothersome):  headache  joint pain  muscle cramps, pain  nausea This list may not describe all possible side effects. Call your doctor for medical advice about side effects. You may report side effects to FDA at 1-800-FDA-1088. Where should I keep my medicine? This medicine is given in a hospital or clinic. It will not be stored at home. NOTE: This sheet is a summary. It may not cover all possible information. If you have questions about this medicine, talk to your doctor, pharmacist, or health care provider.  2021 Elsevier/Gold Standard (2020-05-01 21:35:50)

## 2020-08-15 ENCOUNTER — Other Ambulatory Visit: Payer: Self-pay | Admitting: Hematology and Oncology

## 2020-08-15 ENCOUNTER — Encounter: Payer: Self-pay | Admitting: Oncology

## 2020-08-15 MED ORDER — OMEPRAZOLE 20 MG PO CPDR: 20.0000 mg | DELAYED_RELEASE_CAPSULE | Freq: Every day | ORAL | 0 refills | Status: DC

## 2020-08-18 NOTE — Progress Notes (Deleted)
[facility]  [address] [place],  [place]  [address] [phone]  Clinic Day:  08/18/2020  Referring physician: No ref. provider found   HISTORY OF PRESENT ILLNESS:  The patient is a 60 y.o. 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  Of note, this patient has been dealing with mantle cell lymphoma since 2011.  He recently completed palliative radiation to a left parietal scalp lesion that was biopsy proven in July 2021 to be recurrent mantle cell lymphoma.  In early 2021, the patient received radiation to his tonsillar/cervical regions as a PET scan done showed hypermetabolic disease in these areas.  Prior systemic therapies he has received over the past 10 years have included R hyper-CVAD, BEAM chemotherapy followed by an autologous stem cell rescue, and bendamustine/Rituxan.  He comes in today to go over his PET scan done recently as physical exam findings were worrisome for systemic disease progression, including right lower eyelid fullness.  Since his last visit, the patient underwent a right orbitotomy, whose pathology unsurprisingly came back consistent with mantle cell lymphoma.  Although he recognizes this lesion in the medial crevice of his right eye, it does not cause him any particular problems.  He denies having any B symptoms, but has left preauricular nodal fullness.  PHYSICAL EXAM:  There were no vitals taken for this visit. Wt Readings from Last 3 Encounters:  08/14/20 216 lb 12 oz (98.3 kg)  08/07/20 211 lb (95.7 kg)  08/04/20 211 lb 11.2 oz (96 kg)   There is no height or weight on file to calculate BMI. Performance status (ECOG): 0 Physical ExamSCANS: His PET scan done in early December 2021 revealed the following: FINDINGS: Mediastinal blood pool activity: SUV max 2.09  Liver activity: SUV max 2.94  NECK: Within the medial aspect of the right orbit there is a FDG avid soft tissue mass measuring 3 x 1.2 cm with an SUV max of  5.4, image 56/3.  Multiple prominent lymph nodes are identified within and around the left parotid gland. The largest is identified within the substance of the parotid gland measuring 1.6 x 1.2 cm and has an SUV max of 3.66, image 58/3.  Left, pre auricular soft tissue nodule measures 1.7 x 1.3 cm and has an SUV max of 3.66, image 47/3.Marland Kitchen  Asymmetric increased uptake within the right tonsil region has an SUV max of 6.85.  Left level 2 lymph node measures 1.1 cm within SUV max of 4.18, image 85/3.  Left level 3 lymph node measures 0.8 cm within SUV max of 3.43, image 95/3  Left level 5 node measures 0.9 cm and has an SUV max of 2.8, image 92/3.Marland Kitchen  Incidental CT findings: none  CHEST: No FDG avid axillary, supraclavicular, mediastinal, or hilar lymph nodes.  No FDG avid lung nodules.  Incidental CT findings: none  ABDOMEN/PELVIS: No abnormal hypermetabolic activity within the liver, pancreas, or adrenal glands. No hypermetabolic lymph nodes in the abdomen or pelvis.  Incidental CT findings: Aortic atherosclerosis noted. Previous splenectomy.  SKELETON: No focal hypermetabolic activity to suggest skeletal metastasis.  Incidental CT findings: none  IMPRESSION: 1. FDG avid soft tissue lesion within the medial aspect of the right orbit is identified concerning for tumor. 2. Asymmetric enlarged left parotid gland and left pre auricular lymph nodes concerning for metabolically active tumor 3. Borderline enlarged left level 2 and level 3 lymph nodes, also concerning for sites of disease. Borderline enlarged left level 5 node with mild FDG uptake  is equivocal for lymphoma. 4. No FDG avid tumor identified within the chest, abdomen or pelvis. 5. Aortic Atherosclerosis (ICD10-I70.0).  LABS:     ASSESSMENT & PLAN:  Assessment/Plan:  A 60 y.o. male with recurrent mantle cell lymphoma, which includes biopsy-proven evidence of right lower eyelid involvement.  In clinic today,  I went over his biopsy results and recent PET scan images with him, for which he clearly sees he has systemic involvement of his mantle cell lymphoma.  Based upon this, the plan will be to start this gentleman on ibrutinib 560 mg.  Of note, he has never been previously exposed to any BTK therapy.  The patient was convinced that Rituxan needed to be added to his ibrutinib therapy.  However, this gentleman has been exposed to this agent twice in the past.  I explained to him that because of this, his disease will likely not respond much at all to this agent for a 3rd time.  I emphasized to him how efficacious ibrutinib is by itself in mantle cell lymphoma.  He was made aware of the side effects that can potentially go along with ibrutinib, including bleeding, atrial fibrillation and arthralgias.  As he has this medication at home already, he knows to start it immediately.  I will see him back in 1 month to see how well he is responding to his ibrutinib therapy.  The patient understands all the plans discussed today and is in agreement with them.    Melodye Ped, NP

## 2020-08-19 ENCOUNTER — Inpatient Hospital Stay: Payer: Medicare Other | Attending: Hematology and Oncology

## 2020-08-19 ENCOUNTER — Inpatient Hospital Stay (INDEPENDENT_AMBULATORY_CARE_PROVIDER_SITE_OTHER): Payer: Medicare Other | Admitting: Hematology and Oncology

## 2020-08-19 ENCOUNTER — Other Ambulatory Visit: Payer: Self-pay

## 2020-08-19 ENCOUNTER — Telehealth: Payer: Self-pay | Admitting: Oncology

## 2020-08-19 VITALS — BP 134/80 | HR 58 | Temp 97.7°F | Resp 16 | Ht 69.0 in | Wt 213.0 lb

## 2020-08-19 DIAGNOSIS — C8310 Mantle cell lymphoma, unspecified site: Secondary | ICD-10-CM

## 2020-08-19 LAB — BASIC METABOLIC PANEL
BUN: 13 (ref 4–21)
CO2: 24 — AB (ref 13–22)
Chloride: 106 (ref 99–108)
Creatinine: 1 (ref 0.6–1.3)
Glucose: 93
Potassium: 4.2 (ref 3.4–5.3)
Sodium: 136 — AB (ref 137–147)

## 2020-08-19 LAB — CBC AND DIFFERENTIAL
HCT: 40 — AB (ref 41–53)
Hemoglobin: 13.5 (ref 13.5–17.5)
Neutrophils Absolute: 2.18
Platelets: 252 (ref 150–399)
WBC: 5.9

## 2020-08-19 LAB — COMPREHENSIVE METABOLIC PANEL
Albumin: 4.2 (ref 3.5–5.0)
Calcium: 9 (ref 8.7–10.7)

## 2020-08-19 LAB — HEPATITIS B CORE ANTIBODY, TOTAL: Hep B Core Total Ab: NONREACTIVE

## 2020-08-19 LAB — CBC: RBC: 4.09 (ref 3.87–5.11)

## 2020-08-19 LAB — HEPATIC FUNCTION PANEL
ALT: 18 (ref 10–40)
AST: 23 (ref 14–40)
Alkaline Phosphatase: 57 (ref 25–125)
Bilirubin, Total: 0.5

## 2020-08-19 LAB — HEPATITIS B SURFACE ANTIGEN: Hepatitis B Surface Ag: NONREACTIVE

## 2020-08-19 NOTE — Telephone Encounter (Signed)
Per 1/18 los all appts sched and given to patient

## 2020-08-19 NOTE — Progress Notes (Deleted)
[facility]  [address] [place],  [place]  [address] [phone]  Clinic Day:  08/19/2020  Referring physician: No ref. provider found   CHIEF COMPLAINT:  CC: ***  Current Treatment:  ***   HISTORY OF PRESENT ILLNESS:  Samuel Crane is a 60 y.o. male with a history of *** The patient is a 60 y.o. 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  Of note, this patient has been dealing with mantle cell lymphoma since 2011.  He recently completed palliative radiation to a left parietal scalp lesion that was biopsy proven in July 2021 to be recurrent mantle cell lymphoma.  In early 2021, the patient received radiation to his tonsillar/cervical regions as a PET scan done showed hypermetabolic disease in these areas.  Prior systemic therapies he has received over the past 10 years have included R hyper-CVAD, BEAM chemotherapy followed by an autologous stem cell rescue, and bendamustine/Rituxan.  He comes in today to go over his PET scan done recently as physical exam findings were worrisome for systemic disease progression, including right lower eyelid fullness.  Since his last visit, the patient underwent a right orbitotomy, whose pathology unsurprisingly came back consistent with mantle cell lymphoma.  Although he recognizes this lesion in the medial crevice of his right eye, it does not cause him any particular problems.  He denies having any B symptoms, but has left preauricular nodal fullness.   INTERVAL HISTORY:  Samuel Crane is here today for routine follow up of his ***. He denies fevers or chills. He  denies pain. His appetite is good. His weight {Weight change:10426}.  REVIEW OF SYSTEMS:  Review of Systems - Oncology   VITALS:  There were no vitals taken for this visit.  Wt Readings from Last 3 Encounters:  08/14/20 216 lb 12 oz (98.3 kg)  08/07/20 211 lb (95.7 kg)  08/04/20 211 lb 11.2 oz (96 kg)    There is no height or weight on file to calculate  BMI.  Performance status (ECOG): {CHL ONC [id number]  PHYSICAL EXAM:  Physical Exam Lymph nodes:   There is no cervical, clavicular, axillary or inguinal lymphadenopathy.   LABS:   CBC Latest Ref Rng & Units 08/04/2020 06/24/2020  WBC - 4.6 5.1  Hemoglobin 13.5 - 17.5 13.8 13.4(A)  Hematocrit 41 - 53 40(A) 41  Platelets 150 - 399 249 284   CMP Latest Ref Rng & Units 08/04/2020 06/24/2020  BUN 4 - 21 11 17   Creatinine 0.6 - 1.3 0.9 0.8  Sodium 137 - 147 138 139  Potassium 3.4 - 5.3 4.6 4.6  Chloride 99 - 108 108 107  CO2 13 - 22 23(A) 26(A)  Calcium 8.7 - 10.7 8.8 9.0  Alkaline Phos 25 - 125 57 62  AST 14 - 40 22 25  ALT 10 - 40 19 13     No results found for: CEA1 / No results found for: CEA1 No results found for: PSA1 No results found for: ATF573 No results found for: CAN125  No results found for: TOTALPROTELP, ALBUMINELP, A1GS, A2GS, BETS, BETA2SER, GAMS, MSPIKE, SPEI No results found for: TIBC, FERRITIN, IRONPCTSAT Lab Results  Component Value Date   LDH 137 06/24/2020    STUDIES:  No results found.    HISTORY:  No past medical history on file.  *** The histories are not reviewed yet. Please review them in the "History" navigator section and refresh this [place].  No family history on file.  Social History:  reports that he has been smoking. He has never used smokeless tobacco. No history on file for alcohol use and drug use.The patient is {Blank single:19197::"alone","accompanied by"} *** today.  Allergies: No Known Allergies  Current Medications: Current Outpatient Medications  Medication Sig Dispense Refill  . acetaminophen (TYLENOL) 325 MG tablet Take by mouth.    . celecoxib (CELEBREX) 100 MG capsule Take by mouth.    Mariane Baumgarten Sodium (DSS) 100 MG CAPS Take by mouth.    . gabapentin (NEURONTIN) 100 MG capsule Take by mouth.    . Ibrutinib 560 MG TABS Take by mouth.    . naproxen sodium (ALEVE) 220 MG tablet Take by mouth.    Marland Kitchen ofloxacin  (OCUFLOX) 0.3 % ophthalmic solution Apply to eye.    Marland Kitchen omeprazole (PRILOSEC) 20 MG capsule Take 1 capsule (20 mg total) by mouth daily. 90 capsule 0  . ondansetron (ZOFRAN) 4 MG tablet Take by mouth.    . ondansetron (ZOFRAN) 4 MG tablet Take 1 tablet (4 mg total) by mouth every 4 (four) hours as needed for nausea. 90 tablet 3  . polyethylene glycol powder (GLYCOLAX/MIRALAX) 17 GM/SCOOP powder Take by mouth.    . prochlorperazine (COMPAZINE) 10 MG tablet Take 1 tablet (10 mg total) by mouth every 6 (six) hours as needed for nausea or vomiting. 90 tablet 3   No current facility-administered medications for this visit.     ASSESSMENT & PLAN:   Assessment:  [name] is a 60 y.o. male ***  Plan: *** A 60 y.o. male with recurrent mantle cell lymphoma, which includes biopsy-proven evidence of right lower eyelid involvement.  In clinic today, I went over his biopsy results and recent PET scan images with him, for which he clearly sees he has systemic involvement of his mantle cell lymphoma.  Based upon this, the plan will be to start this gentleman on ibrutinib 560 mg.  Of note, he has never been previously exposed to any BTK therapy.  The patient was convinced that Rituxan needed to be added to his ibrutinib therapy.  However, this gentleman has been exposed to this agent twice in the past.  I explained to him that because of this, his disease will likely not respond much at all to this agent for a 3rd time.  I emphasized to him how efficacious ibrutinib is by itself in mantle cell lymphoma.  He was made aware of the side effects that can potentially go along with ibrutinib, including bleeding, atrial fibrillation and arthralgias.  As he has this medication at home already, he knows to start it immediately.  I will see him back in 1 month to see how well he is responding to his ibrutinib therapy.  The patient understands all the plans discussed today and is in agreement with them.     The patient  understands the plans discussed today and is in agreement with them.  He knows to contact our office if he develops concerns prior to his next appointment.   I provided *** minutes (2:29 PM - 2:29 PM) of face-to-face time during this this encounter and > 50% was spent counseling as documented under my assessment and plan.    Melodye Ped, NP

## 2020-08-19 NOTE — Progress Notes (Signed)
[facility]  [address] [place],  [place]  [address] [phone]  Clinic Day:  08/19/2020  Referring physician: No ref. provider found   CHIEF COMPLAINT:  CC: A 60 year old male with recurrent mantle cell lymphoma.  Current Treatment:  Rituximab/ Ibrutinib   HISTORY OF PRESENT ILLNESS:  Samuel Crane is a 60 y.o. male with a history of recurrent mantle cell lymphoma. Of note, this patient has been dealing with mantle cell lymphoma since 2011. He recently completed palliative radiation therapy to a left parietal scalp lesion that was biopsy proven in July 2021 to be recurrent mantle cell lymphoma. In early 2021, the patient received radiation to his tonsillar/cervical regions as a PET scan done showed hypermetabolic disease in those areas. Prior systemic therapies he has received over the past 10 years have included R hyper-CVAD, BEAM chemotherapy followed by an autologous stem cell rescue, and bendamustine/ Rituxan. He began having physical findings concerning for systemic disease progression including right lower eyelid fullness. He underwent a right orbitotomy, whose pathology unsurprisingly came back consistent with mantle cell lymphoma. He denied having any B symptoms, but had preauricular nodal fullness. PET imaging from early December revealed 1. FDG avid soft tissue lesion within the medial aspect of the right orbit is identified concerning for tumor. 2. Asymmetric enlarged left parotid gland and left pre auricular lymph nodes concerning for metabolically active tumor. 3. Borderline enlarged left level 2 and level 3 lymph nodes, also concerning for sites of disease. Borderline enlarged left level 5 node with mild FDG uptake is equivocal for lymphoma. 4. No FDG avid tumor identified within the chest, abdomen or pelvis.   There was discussion to start him on ibrutinib 560 mg. Of note, he had never been previously exposed to [place] therapy. The patient was  convinced that Rituxan needed to be added to his ibrutinib therapy. However, this gentleman has been exposed to this agent twice in the past. It was explained to hm that because of this, his disease will likely not respond much at all to this agent for a third time. It was emphasized to him how efficacious ibrutinib is by itself in mantle cell lymphoma. The patient was still insistent that Rituxan be added to his regimen based upon studies he found during his own research. Rituxan was ultimately added to the patient's plan and he has initiated ibrutinib as well.   INTERVAL HISTORY:  Samuel Crane is here today for routine follow up of his recurrent mantle cell lymphoma prior to a second cycle of Rituxan. He continues on ibrutinib daily without difficulty. He also reports tolerating his Rituxan well. He had minimal nausea and no vomiting.  He denies fevers or chills. He  denies pain. His appetite is good. His weight has increased 2 pounds over last 2 weeks. CBC and CMP are unremarkable today. He presents with several copies of studies regarding Rituxan treatment in mantle cell lymphoma as well as MRD testing and would like to discuss several questions with Dr. Bobby Rumpf.   REVIEW OF SYSTEMS:  Review of Systems  Constitutional: Negative for appetite change, chills, diaphoresis, fatigue, fever and unexpected weight change.  HENT:   Negative for hearing loss, lump/mass, mouth sores, nosebleeds, sore throat, tinnitus, trouble swallowing and voice change.   Eyes: Positive for eye problems. Negative for icterus.  Respiratory: Negative for chest tightness, cough, hemoptysis, shortness of breath and wheezing.   Cardiovascular: Negative for chest pain, leg swelling and palpitations.  Gastrointestinal: Negative for abdominal  distention, abdominal pain, blood in stool, constipation, diarrhea, nausea, rectal pain and vomiting.  Endocrine: Negative for hot flashes.  Genitourinary: Negative for bladder incontinence, difficulty  urinating, dyspareunia, dysuria, frequency, hematuria and nocturia.   Musculoskeletal: Negative for arthralgias, back pain, flank pain, gait problem, myalgias, neck pain and neck stiffness.  Skin: Negative for itching, rash and wound.  Neurological: Negative for dizziness, extremity weakness, gait problem, headaches, light-headedness, numbness, seizures and speech difficulty.  Hematological: Negative for adenopathy. Does not bruise/bleed easily.  Psychiatric/Behavioral: Negative for confusion, decreased concentration, depression, sleep disturbance and suicidal ideas. The patient is not nervous/anxious.      VITALS:  Blood pressure 134/80, pulse (!) 58, temperature 97.7 F (36.5 C), resp. rate 16, height 5\' 9"  (1.753 m), weight 213 lb (96.6 kg), SpO2 95 %.  Wt Readings from Last 3 Encounters:  08/19/20 213 lb (96.6 kg)  08/14/20 216 lb 12 oz (98.3 kg)  08/07/20 211 lb (95.7 kg)    Body mass index is 31.45 kg/m.  Performance status (ECOG): 1 - Symptomatic but completely ambulatory  PHYSICAL EXAM:  Physical Exam Constitutional:      General: He is not in acute distress.    Appearance: Normal appearance. He is normal weight. He is not ill-appearing, toxic-appearing or diaphoretic.  HENT:     Head: Normocephalic and atraumatic.     Right Ear: Tympanic membrane normal.     Left Ear: Tympanic membrane normal.     Nose: Nose normal. No congestion or rhinorrhea.     Mouth/Throat:     Mouth: Mucous membranes are moist.     Pharynx: Oropharynx is clear. No oropharyngeal exudate or posterior oropharyngeal erythema.  Eyes:     General: No scleral icterus.       Right eye: No discharge.        Left eye: No discharge.  Neck:     Vascular: No carotid bruit.  Cardiovascular:     Rate and Rhythm: Normal rate and regular rhythm.     Heart sounds: No murmur heard. No friction rub. No gallop.   Pulmonary:     Effort: Pulmonary effort is normal. No respiratory distress.     Breath sounds:  Normal breath sounds. No stridor. No wheezing, rhonchi or rales.  Chest:     Chest wall: No tenderness.  Abdominal:     General: Abdomen is flat. Bowel sounds are normal. There is no distension.     Palpations: There is no mass.     Tenderness: There is no abdominal tenderness. There is no right CVA tenderness, left CVA tenderness, guarding or rebound.     Hernia: No hernia is present.  Musculoskeletal:        General: No swelling, tenderness, deformity or signs of injury. Normal range of motion.     Cervical back: Normal range of motion and neck supple. No rigidity or tenderness.     Right lower leg: No edema.     Left lower leg: No edema.  Lymphadenopathy:     Cervical: No cervical adenopathy.  Skin:    General: Skin is warm and dry.     Capillary Refill: Capillary refill takes less than 2 seconds.     Coloration: Skin is not jaundiced or pale.     Findings: No bruising, erythema, lesion or rash.  Neurological:     General: No focal deficit present.     Mental Status: He is alert and oriented to person, place, and time. Mental status is at  baseline.     Cranial Nerves: No cranial nerve deficit.     Sensory: No sensory deficit.     Motor: No weakness.     Coordination: Coordination normal.     Gait: Gait normal.     Deep Tendon Reflexes: Reflexes normal.  Psychiatric:        Mood and Affect: Mood normal.        Behavior: Behavior normal.        Thought Content: Thought content normal.        Judgment: Judgment normal.    Lymph nodes:   There is no cervical, clavicular, axillary or inguinal lymphadenopathy.   LABS:   CBC Latest Ref Rng & Units 08/19/2020 08/04/2020 06/24/2020  WBC - 5.9 4.6 5.1  Hemoglobin 13.5 - 17.5 13.5 13.8 13.4(A)  Hematocrit 41 - 53 40(A) 40(A) 41  Platelets 150 - 399 252 249 284   CMP Latest Ref Rng & Units 08/19/2020 08/04/2020 06/24/2020  BUN 4 - 21 13 11 17   Creatinine 0.6 - 1.3 1.0 0.9 0.8  Sodium 137 - 147 136(A) 138 139  Potassium 3.4 - 5.3  4.2 4.6 4.6  Chloride 99 - 108 106 108 107  CO2 13 - 22 24(A) 23(A) 26(A)  Calcium 8.7 - 10.7 9.0 8.8 9.0  Alkaline Phos 25 - 125 57 57 62  AST 14 - 40 23 22 25   ALT 10 - 40 18 19 13      No results found for: CEA1 / No results found for: CEA1 No results found for: PSA1 No results found for: [id number] No results found for: CAN125  No results found for: TOTALPROTELP, ALBUMINELP, A1GS, A2GS, BETS, BETA2SER, GAMS, MSPIKE, SPEI No results found for: TIBC, FERRITIN, IRONPCTSAT Lab Results  Component Value Date   LDH 137 06/24/2020    STUDIES:  No results found.    HISTORY:  No past medical history on file.    No family history on file.  Social History:  reports that he has been smoking. He has never used smokeless tobacco. No history on file for alcohol use and drug use.The patient is alone  today.  Allergies: No Known Allergies  Current Medications: Current Outpatient Medications  Medication Sig Dispense Refill  . acetaminophen (TYLENOL) 325 MG tablet Take by mouth.    . celecoxib (CELEBREX) 100 MG capsule Take by mouth.    Mariane Baumgarten Sodium (DSS) 100 MG CAPS Take by mouth.    . gabapentin (NEURONTIN) 100 MG capsule Take by mouth.    . Ibrutinib 560 MG TABS Take by mouth.    . naproxen sodium (ALEVE) 220 MG tablet Take by mouth.    Marland Kitchen ofloxacin (OCUFLOX) 0.3 % ophthalmic solution Apply to eye.    Marland Kitchen omeprazole (PRILOSEC) 20 MG capsule Take 1 capsule (20 mg total) by mouth daily. 90 capsule 0  . ondansetron (ZOFRAN) 4 MG tablet Take by mouth.    . ondansetron (ZOFRAN) 4 MG tablet Take 1 tablet (4 mg total) by mouth every 4 (four) hours as needed for nausea. 90 tablet 3  . polyethylene glycol powder (GLYCOLAX/MIRALAX) 17 GM/SCOOP powder Take by mouth.    . prochlorperazine (COMPAZINE) 10 MG tablet Take 1 tablet (10 mg total) by mouth every 6 (six) hours as needed for nausea or vomiting. 90 tablet 3   No current facility-administered medications for this visit.      ASSESSMENT & PLAN:   Assessment:  Savonte Woolverton is a 60 y.o. male with recurrent mantle  cell lymphoma. He is currently undergoing treatment with Rituxan and ibrutinib which he seems to be tolerating well. He does still have many concerns regarding his treatment plan. He will discuss these concerns with Dr. Bobby Rumpf  Plan: He will continue daily ibrutinib and Rituxan infusions as scheduled. He will return to clinic in 2 weeks for repeat CBC, CMP and evaluation with Dr. Bobby Rumpf.   The patient understands the plans discussed today and is in agreement with them.  He knows to contact our office if he develops concerns prior to his next appointment.   I provided 30 minutes of face-to-face time during this this encounter and > 50% was spent counseling as documented under my assessment and plan.    Melodye Ped, NP

## 2020-08-21 ENCOUNTER — Other Ambulatory Visit: Payer: Self-pay

## 2020-08-21 ENCOUNTER — Inpatient Hospital Stay: Payer: Medicare Other

## 2020-08-21 VITALS — BP 126/76 | HR 50 | Temp 98.0°F | Resp 18 | Ht 69.0 in | Wt 211.5 lb

## 2020-08-21 DIAGNOSIS — C8310 Mantle cell lymphoma, unspecified site: Secondary | ICD-10-CM

## 2020-08-21 DIAGNOSIS — Z5112 Encounter for antineoplastic immunotherapy: Secondary | ICD-10-CM | POA: Diagnosis not present

## 2020-08-21 MED ORDER — ACETAMINOPHEN 325 MG PO TABS
650.0000 mg | ORAL_TABLET | Freq: Once | ORAL | Status: AC
  Administered 2020-08-21: 650 mg via ORAL

## 2020-08-21 MED ORDER — SODIUM CHLORIDE 0.9 % IV SOLN
Freq: Once | INTRAVENOUS | Status: AC
  Filled 2020-08-21: qty 250

## 2020-08-21 MED ORDER — DIPHENHYDRAMINE HCL 25 MG PO CAPS
50.0000 mg | ORAL_CAPSULE | Freq: Once | ORAL | Status: AC
  Administered 2020-08-21: 50 mg via ORAL

## 2020-08-21 MED ORDER — ACETAMINOPHEN 325 MG PO TABS
ORAL_TABLET | ORAL | Status: AC
  Filled 2020-08-21: qty 2

## 2020-08-21 MED ORDER — DIPHENHYDRAMINE HCL 25 MG PO CAPS
ORAL_CAPSULE | ORAL | Status: AC
  Filled 2020-08-21: qty 2

## 2020-08-21 MED ORDER — SODIUM CHLORIDE 0.9 % IV SOLN
375.0000 mg/m2 | Freq: Once | INTRAVENOUS | Status: AC
  Administered 2020-08-21: 800 mg via INTRAVENOUS
  Filled 2020-08-21: qty 50

## 2020-08-21 NOTE — Patient Instructions (Signed)
[facility] Discharge Instructions for Patients Receiving Chemotherapy  Today you received the following chemotherapy agents Rituximab.  To help prevent nausea and vomiting after your treatment, we encourage you to take your nausea medication.   If you develop nausea and vomiting that is not controlled by your nausea medication, call the clinic.   BELOW ARE SYMPTOMS THAT SHOULD BE REPORTED IMMEDIATELY:  *FEVER GREATER THAN 100.5 F  *CHILLS WITH OR WITHOUT FEVER  NAUSEA AND VOMITING THAT IS NOT CONTROLLED WITH YOUR NAUSEA MEDICATION  *UNUSUAL SHORTNESS OF BREATH  *UNUSUAL BRUISING OR BLEEDING  TENDERNESS IN MOUTH AND THROAT WITH OR WITHOUT PRESENCE OF ULCERS  *URINARY PROBLEMS  *BOWEL PROBLEMS  UNUSUAL RASH Items with * indicate a potential emergency and should be followed up as soon as possible.  Feel free to call the clinic should you have any questions or concerns at The clinic phone number is [phone].  Please show the CHEMO ALERT CARD at check-in to the Emergency Department and triage nurse.   

## 2020-08-25 ENCOUNTER — Other Ambulatory Visit: Payer: Self-pay | Admitting: Hematology and Oncology

## 2020-08-25 MED ORDER — ONDANSETRON HCL 4 MG PO TABS: 4.0000 mg | ORAL_TABLET | ORAL | 1 refills | Status: DC | PRN

## 2020-08-25 MED ORDER — PROCHLORPERAZINE MALEATE 10 MG PO TABS: 10.0000 mg | ORAL_TABLET | Freq: Four times a day (QID) | ORAL | 3 refills | Status: DC | PRN

## 2020-08-25 NOTE — Progress Notes (Signed)
Met with patient on 01/18 and had him sign paperwork for [employer]. He brought in his financial information today, so enrollment is complete.

## 2020-08-26 ENCOUNTER — Other Ambulatory Visit: Payer: Self-pay | Admitting: Oncology

## 2020-08-27 ENCOUNTER — Telehealth: Payer: Self-pay

## 2020-08-27 NOTE — Telephone Encounter (Signed)
I spoke with pt to see how he is tolerating the [place]. Pt states he has noticed a acne like rash that has formed @ his neck area. He said he had applied some acne medication and some antibiotic ointment to it, as he wasn't sure what it was. I encouraged the pt to avoid those medication listed above. I told him I would notify the provider of rash. Pt admits to occasional nausea, & occasional heartburn. He is using omeprazole PRN. He is taking medication in the morning, without food. Has missed only 1 dose about 8-9 days ago. Confirmed next appt, which is tomorrow for Rituxan. I told him to make sure he showed the nurses the rash tomorrow. Reminded pt to importance of calling us with temp of 100.4 or higher, day or night @ [phone]. Pt verbalized understanding.

## 2020-08-28 ENCOUNTER — Other Ambulatory Visit: Payer: Self-pay | Admitting: Hematology and Oncology

## 2020-08-28 ENCOUNTER — Inpatient Hospital Stay: Payer: Medicare Other

## 2020-08-28 ENCOUNTER — Other Ambulatory Visit: Payer: Self-pay

## 2020-08-28 VITALS — BP 112/66 | HR 57 | Temp 98.1°F | Resp 18 | Ht 69.0 in | Wt 213.0 lb

## 2020-08-28 DIAGNOSIS — Z5112 Encounter for antineoplastic immunotherapy: Secondary | ICD-10-CM | POA: Diagnosis not present

## 2020-08-28 DIAGNOSIS — C8310 Mantle cell lymphoma, unspecified site: Secondary | ICD-10-CM

## 2020-08-28 MED ORDER — SODIUM CHLORIDE 0.9 % IV SOLN
Freq: Once | INTRAVENOUS | Status: AC
  Filled 2020-08-28: qty 250

## 2020-08-28 MED ORDER — ACETAMINOPHEN 325 MG PO TABS
650.0000 mg | ORAL_TABLET | Freq: Once | ORAL | Status: AC
  Administered 2020-08-28: 650 mg via ORAL

## 2020-08-28 MED ORDER — ACETAMINOPHEN 325 MG PO TABS
ORAL_TABLET | ORAL | Status: AC
  Filled 2020-08-28: qty 2

## 2020-08-28 MED ORDER — HEPARIN SOD (PORK) LOCK FLUSH 100 UNIT/ML IV SOLN
500.0000 [IU] | Freq: Once | INTRAVENOUS | Status: DC | PRN
  Filled 2020-08-28: qty 5

## 2020-08-28 MED ORDER — DIPHENHYDRAMINE HCL 25 MG PO CAPS
50.0000 mg | ORAL_CAPSULE | Freq: Once | ORAL | Status: AC
  Administered 2020-08-28: 50 mg via ORAL

## 2020-08-28 MED ORDER — HYDROCORTISONE 2.5 % EX CREA: TOPICAL_CREAM | Freq: Two times a day (BID) | CUTANEOUS | 1 refills | Status: DC

## 2020-08-28 MED ORDER — DIPHENHYDRAMINE HCL 25 MG PO CAPS
ORAL_CAPSULE | ORAL | Status: AC
  Filled 2020-08-28: qty 2

## 2020-08-28 MED ORDER — SODIUM CHLORIDE 0.9 % IV SOLN
375.0000 mg/m2 | Freq: Once | INTRAVENOUS | Status: AC
  Administered 2020-08-28: 800 mg via INTRAVENOUS
  Filled 2020-08-28: qty 50

## 2020-08-28 NOTE — Patient Instructions (Signed)
[facility] Discharge Instructions for Patients Receiving Chemotherapy  Today you received the following chemotherapy agents rituximab  To help prevent nausea and vomiting after your treatment, we encourage you to take your nausea medication.   If you develop nausea and vomiting that is not controlled by your nausea medication, call the clinic.   BELOW ARE SYMPTOMS THAT SHOULD BE REPORTED IMMEDIATELY:  *FEVER GREATER THAN 100.5 F  *CHILLS WITH OR WITHOUT FEVER  NAUSEA AND VOMITING THAT IS NOT CONTROLLED WITH YOUR NAUSEA MEDICATION  *UNUSUAL SHORTNESS OF BREATH  *UNUSUAL BRUISING OR BLEEDING  TENDERNESS IN MOUTH AND THROAT WITH OR WITHOUT PRESENCE OF ULCERS  *URINARY PROBLEMS  *BOWEL PROBLEMS  UNUSUAL RASH Items with * indicate a potential emergency and should be followed up as soon as possible.  Feel free to call the clinic should you have any questions or concerns at The clinic phone number is [phone].  Please show the CHEMO ALERT CARD at check-in to the Emergency Department and triage nurse.   

## 2020-09-01 ENCOUNTER — Other Ambulatory Visit: Payer: Self-pay | Admitting: Oncology

## 2020-09-02 ENCOUNTER — Other Ambulatory Visit: Payer: Self-pay | Admitting: Oncology

## 2020-09-02 ENCOUNTER — Other Ambulatory Visit: Payer: Self-pay | Admitting: Hematology and Oncology

## 2020-09-02 ENCOUNTER — Other Ambulatory Visit: Payer: Self-pay

## 2020-09-02 ENCOUNTER — Inpatient Hospital Stay: Payer: Medicare Other | Attending: Oncology

## 2020-09-02 ENCOUNTER — Inpatient Hospital Stay (INDEPENDENT_AMBULATORY_CARE_PROVIDER_SITE_OTHER): Payer: Medicare Other | Admitting: Oncology

## 2020-09-02 VITALS — BP 126/78 | HR 55 | Temp 97.9°F | Resp 16 | Ht 69.0 in | Wt 214.6 lb

## 2020-09-02 DIAGNOSIS — C8310 Mantle cell lymphoma, unspecified site: Secondary | ICD-10-CM

## 2020-09-02 LAB — CBC AND DIFFERENTIAL
HCT: 41 (ref 41–53)
Hemoglobin: 13.8 (ref 13.5–17.5)
Neutrophils Absolute: 4.48
Platelets: 251 (ref 150–399)
WBC: 7.6

## 2020-09-02 LAB — CBC: RBC: 4.15 (ref 3.87–5.11)

## 2020-09-02 LAB — COMPREHENSIVE METABOLIC PANEL
Albumin: 4.2 (ref 3.5–5.0)
Calcium: 8.7 (ref 8.7–10.7)

## 2020-09-02 LAB — HEPATIC FUNCTION PANEL
ALT: 15 (ref 10–40)
AST: 21 (ref 14–40)
Alkaline Phosphatase: 52 (ref 25–125)
Bilirubin, Total: 0.4

## 2020-09-02 LAB — BASIC METABOLIC PANEL
BUN: 16 (ref 4–21)
CO2: 27 — AB (ref 13–22)
Chloride: 105 (ref 99–108)
Creatinine: 1 (ref 0.6–1.3)
Glucose: 87
Potassium: 4 (ref 3.4–5.3)
Sodium: 139 (ref 137–147)

## 2020-09-02 NOTE — Progress Notes (Unsigned)
The patient is a 60 year old male with recurrent mantle cell lymphoma.  Patient presents to clinic today for chemotherapy education and palliative care consult.  We will start ibrutinib and rituxan once authorization has been obtained.  We will send in prescriptions for prochlorperazine and ondansetron.  The patient verbalizes understanding of and agreement to the plan as discussed today.  Provided general information including the following: 1.  Date of education: 08-05-2020 2.  Physician name: Dr. Bobby Rumpf 3.  Diagnosis: Mantle Cell Lymphoma 4.  Stage: Recurrent 5.  Curative or palliative 6.  Chemotherapy plan including drugs and how often: Ibrutinib, Rituximab IV weekly x 4 doses and then every 4 weeks 7.  Start date: 08-07-2020 8.  Other referrals: No other referrals at this time. 9.  The patient is to call our office with any questions or concerns.  Our office number [phone], if after hours or on the weekend, call the same number and wait for the answering service.  There is always an oncologist on call 10.  Medications prescribed: Zofran and Compazine 11.  The patient has verbalized understanding of the treatment plan and has no barriers to adherence or understanding.  Obtained signed consent from patient.  Discussed symptoms including 1.  Low blood counts including red blood cells, white blood cells and platelets. 2. Infection including to avoid large crowds, wash hands frequently, and stay away from people who were sick.  If fever develops of 100.4 or higher, call our office. 3.  Mucositis-given instructions on mouth rinse (baking soda and salt mixture).  Keep mouth clean.  Use soft bristle toothbrush.  If mouth sores develop, call our clinic. 4.  Nausea/vomiting-gave prescriptions for ondansetron 4 mg every 4 hours as needed for nausea, may take around the clock if persistent.  Compazine 10 mg every 6 hours, may take around the clock if persistent. 5.  Diarrhea-use over-the-counter  Imodium.  Call clinic if not controlled. 6.  Constipation-use senna, 1 to 2 tablets twice a day.  If no BM in 2 to 3 days call the clinic. 7.  Loss of appetite-try to eat small meals every 2-3 hours.  Call clinic if not eating. 8.  Taste changes-zinc 500 mg daily.  If becomes severe call clinic. 9.  Alcoholic beverages. 10.  Drink 2 to 3 quarts of water per day. 11.  Peripheral neuropathy-patient to call if numbness or tingling in hands or feet is persistent   Gave information on the supportive care team and how to contact them regarding services.  Discussed advanced directives.  The patient does not have their advanced directives but will look at the copy provided in their notebook and will call with any questions. [employer] Social worker Advanced directives  Answered questions to patient satisfaction.  Patient is to call with any further questions or concerns.  Time spent on this palliative care/chemotherapy education was 60 minutes with more than 50% spent discussing diagnosis, prognosis and symptom management.  The medication prescribed to the patient will be printed out from [email or web address] This will give the following information: Name of your medication Approved uses Dose and schedule Storage and handling Handling body fluids and waste Drug and food interactions Possible side effects and management Pregnancy, sexual activity, and contraception Obtaining medication

## 2020-09-02 NOTE — Progress Notes (Signed)
[facility]  [address] [place],  [place]  [address] [phone]  Clinic Day:  09/02/2020  HISTORY OF PRESENT ILLNESS:  The patient is a 60 y.o. 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  He comes in today to be reassessed after being on ibrutinib/Rituxan for the past month.  Overall, the patient has been doing well.  He has definitely noticed an improvement in his known areas of disease since this combination regimen has been started.  He denies having any side effects from either agent.    Of note, this patient has been dealing with mantle cell lymphoma since 2011.  He recently completed palliative radiation to a left parietal scalp lesion that was biopsy proven in July 2021 to be recurrent mantle cell lymphoma.  In early 2021, the patient received radiation to his tonsillar/cervical regions as a PET scan done showed hypermetabolic disease in these areas.  Prior systemic therapies he has received over the past 10 years have included R hyper-CVAD, BEAM chemotherapy followed by an autologous stem cell rescue, and bendamustine/Rituxan.   PHYSICAL EXAM:  Blood pressure 126/78, pulse (!) 55, temperature 97.9 F (36.6 C), resp. rate 16, height 5\' 9"  (1.753 m), weight 214 lb 9.6 oz (97.3 kg), SpO2 97 %. Wt Readings from Last 3 Encounters:  09/02/20 214 lb 9.6 oz (97.3 kg)  08/28/20 213 lb (96.6 kg)  08/21/20 211 lb 8 oz (95.9 kg)   Body mass index is 31.69 kg/m. Performance status (ECOG): 0 Physical Exam Constitutional:      General: He is not in acute distress.    Appearance: Normal appearance. He is normal weight. He is not ill-appearing.  HENT:     Mouth/Throat:     Mouth: Mucous membranes are moist.     Pharynx: Oropharynx is clear. No oropharyngeal exudate or posterior oropharyngeal erythema.  Eyes:     General:        Right eye: Foreign body (fullness in his lower eyelid has nearly completely dissipated) present.  Cardiovascular:     Rate and  Rhythm: Normal rate and regular rhythm.     Heart sounds: No murmur heard. No friction rub. No gallop.   Pulmonary:     Effort: Pulmonary effort is normal. No respiratory distress.     Breath sounds: Normal breath sounds. No wheezing, rhonchi or rales.  Chest:  Breasts:     Right: No axillary adenopathy or supraclavicular adenopathy.     Left: Supraclavicular adenopathy (left preauricalar lympadenopathy is now only 1-2 cm and is no longer visible) present. No axillary adenopathy.    Abdominal:     General: Bowel sounds are normal. There is no distension.     Palpations: Abdomen is soft. There is no mass.     Tenderness: There is no abdominal tenderness.  Musculoskeletal:        General: No swelling.     Right lower leg: No edema.     Left lower leg: No edema.  Lymphadenopathy:     Cervical: No cervical adenopathy.     Upper Body:     Right upper body: No supraclavicular or axillary adenopathy.     Left upper body: Supraclavicular adenopathy (left preauricalar lympadenopathy is now only 1-2 cm and is no longer visible) present. No axillary adenopathy.     Lower Body: No right inguinal adenopathy. No left inguinal adenopathy.  Skin:    General: Skin is warm.     Coloration: Skin is not jaundiced.  Findings: No lesion or rash.  Neurological:     General: No focal deficit present.     Mental Status: He is alert and oriented to person, place, and time. Mental status is at baseline.     Cranial Nerves: Cranial nerves are intact. No cranial nerve deficit.     Motor: No weakness.  Psychiatric:        Mood and Affect: Mood normal.        Behavior: Behavior normal.        Thought Content: Thought content normal.     LABS:    Ref. Range 09/02/2020 00:00  Sodium Latest Ref Range: 137 - 147  139  Potassium Latest Ref Range: 3.4 - 5.3  4.0  Chloride Latest Ref Range: 99 - 108  105  CO2 Latest Ref Range: 13 - 22  27 (A)  Glucose Unknown 87  BUN Latest Ref Range: 4 - 21  16   Creatinine Latest Ref Range: 0.6 - 1.3  1.0  Calcium Latest Ref Range: 8.7 - 10.7  8.7  Alkaline Phosphatase Latest Ref Range: 25 - 125  52  Albumin Latest Ref Range: 3.5 - 5.0  4.2  AST Latest Ref Range: 14 - 40  21  ALT Latest Ref Range: 10 - 40  15  Bilirubin, Total Unknown 0.4  WBC Unknown 7.6  RBC Latest Ref Range: 3.87 - 5.11  4.15  Hemoglobin Latest Ref Range: 13.5 - 17.5  13.8  HCT Latest Ref Range: 41 - 53  41  Platelets Latest Ref Range: 150 - 399  251  NEUT# Unknown 4.48      ASSESSMENT & PLAN:  Assessment/Plan:  A 60 y.o. male with recurrent mantle cell lymphoma, which includes biopsy-proven evidence of right lower eyelid involvement.  Based upon his physical exam today, it clearly appears he is already deriving a clinical benefit from his treatment as his left preauricular lymphadenopathy and right eyelid fullness as almost completely dissipated.  He knows to continue taking ibrutinib 560 mg daily.  Rituxan will now be spaced out to once every 4 weeks, with his next dose to be given in early March 2022.  I will see him back in 1 month for repeat clinical assessment.  The patient understands all the plans discussed today and is in agreement with them.    Park Beck Kirby Funk, MD

## 2020-09-04 ENCOUNTER — Telehealth: Payer: Self-pay

## 2020-09-04 ENCOUNTER — Other Ambulatory Visit: Payer: Medicare Other

## 2020-09-04 ENCOUNTER — Ambulatory Visit: Payer: Medicare Other | Admitting: Hematology and Oncology

## 2020-09-04 NOTE — Telephone Encounter (Signed)
Pt returned my call. He has started the next cycle of Imbruvica. He is starting his 28d cycle. He had a blister pack of Imbruvica left that he was taking when he was in the hospital, so he has extra's. His next shipment is scheduled for 09/09/2020 per [employer]. Pt is now taking the medication @ 6am w/coffee. No missed doses since our last conversation. Reminded pt of importance of calling us day or night (@ [phone] )if temp 100.4 or over. Pt verbalized understanding.   I attempted call to pt to ask when he is to start his next cycle of Imbruvica, as well as how is tolerating the drug.

## 2020-09-11 ENCOUNTER — Telehealth: Payer: Self-pay

## 2020-09-11 NOTE — Telephone Encounter (Signed)
I spoke with pt to see how he is tolerating the Imbruvica (cycle 2 week 1). Pt states, "I'm doing ok Samuel Crane. The skin rash is going away with the cream I'm using". Pt denies N/V, diarrhea, and fevers. He continues to take his Imbruvica @ 6am daily with coffee. No missed doses. Pt reminded to call us if temp rises to 100.4 or higher, day or night. Pt verbalized understanding. Confirmed next appt.

## 2020-09-23 ENCOUNTER — Telehealth: Payer: Self-pay

## 2020-09-23 NOTE — Telephone Encounter (Signed)
Pt called to ask if I knew if medicare would pay for the lab test called MRD (minimal residual disease) to be drawn? He states he called Medicare himself to get the ball rolling, and was told that the doctor's office would need to contact them through the provider line.  I told pt, I have not heard of the MRD testing, but Dr Bobby Rumpf probably knows exactly what he is talking about. Mr Barga states that the MRD test is a new prognostic tool. He asked if he would need to discuss this with Dr Bobby Rumpf when he comes in for next appt? I replied, "Yes, as he would be the one who orders the MRD test". Once the order is placed, authorization from insurance would be required before specimen is sent.  Pt mentions that he had sent Dr Bobby Rumpf a message in [place] regarding this a few weeks back, and didn't get a response (Dr Bobby Rumpf was out of clinic @ that time on [facility]). He plans to send another MyChart message to Dr Bobby Rumpf again, in hopes that he will respond. If he doesn't respond, at least he would be able to pull it up when he comes in at next appointment.

## 2020-09-28 ENCOUNTER — Encounter: Payer: Self-pay | Admitting: Oncology

## 2020-09-30 ENCOUNTER — Other Ambulatory Visit: Payer: Medicare Other

## 2020-09-30 ENCOUNTER — Ambulatory Visit: Payer: Medicare Other | Admitting: Oncology

## 2020-09-30 NOTE — Progress Notes (Signed)
[facility]  [address] [place],  [place]  [address] [phone]  Clinic Day:  10/01/2020  HISTORY OF PRESENT ILLNESS:  The patient is a 60 y.o. 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  He comes in today to be reassessed after being on ibrutinib/Rituxan for the past 2 months.  Overall, the patient has been doing well.  He has definitely noticed an improvement in his known areas of disease since this combination regimen has been started.  He denies having any side effects from his therapy.   Of note, this patient has been dealing with mantle cell lymphoma since 2011.  He recently completed palliative radiation to a left parietal scalp lesion that was biopsy proven in July 2021 to be recurrent mantle cell lymphoma.  In early 2021, the patient received radiation to his tonsillar/cervical regions as a PET scan done showed hypermetabolic disease in these areas.  Prior systemic therapies he has received over the past 10 years have included R hyper-CVAD, BEAM chemotherapy followed by an autologous stem cell rescue, and bendamustine/Rituxan.   PHYSICAL EXAM:  Blood pressure (!) 144/82, pulse 64, temperature 98 F (36.7 C), resp. rate 16, height 5\' 9"  (1.753 m), weight 215 lb 3.2 oz (97.6 kg), SpO2 94 %. Wt Readings from Last 3 Encounters:  10/01/20 215 lb 3.2 oz (97.6 kg)  09/02/20 214 lb 9.6 oz (97.3 kg)  08/28/20 213 lb (96.6 kg)   Body mass index is 31.78 kg/m. Performance status (ECOG): 0 Physical Exam Constitutional:      General: He is not in acute distress.    Appearance: Normal appearance. He is normal weight. He is not ill-appearing.  HENT:     Mouth/Throat:     Mouth: Mucous membranes are moist.     Pharynx: Oropharynx is clear. No oropharyngeal exudate or posterior oropharyngeal erythema.  Eyes:     General:        Right eye: Foreign body (fullness in his lower eyelid/medial eye has nearly completely dissipated) present.  Cardiovascular:      Rate and Rhythm: Normal rate and regular rhythm.     Heart sounds: No murmur heard. No friction rub. No gallop.   Pulmonary:     Effort: Pulmonary effort is normal. No respiratory distress.     Breath sounds: Normal breath sounds. No wheezing, rhonchi or rales.  Chest:  Breasts:     Right: No axillary adenopathy or supraclavicular adenopathy.     Left: Supraclavicular adenopathy (left preauricalar lympadenopathy is now only 1-2 cm and is no longer visible) present. No axillary adenopathy.    Abdominal:     General: Bowel sounds are normal. There is no distension.     Palpations: Abdomen is soft. There is no mass.     Tenderness: There is no abdominal tenderness.  Musculoskeletal:        General: No swelling.     Right lower leg: No edema.     Left lower leg: No edema.  Lymphadenopathy:     Cervical: No cervical adenopathy.     Upper Body:     Right upper body: No supraclavicular or axillary adenopathy.     Left upper body: Supraclavicular adenopathy (left preauricalar lympadenopathy is now only 1-2 cm and is no longer visible) present. No axillary adenopathy.     Lower Body: No right inguinal adenopathy. No left inguinal adenopathy.  Skin:    General: Skin is warm.     Coloration: Skin is not jaundiced.  Findings: No lesion or rash.  Neurological:     General: No focal deficit present.     Mental Status: He is alert and oriented to person, place, and time. Mental status is at baseline.     Cranial Nerves: Cranial nerves are intact. No cranial nerve deficit.     Motor: No weakness.  Psychiatric:        Mood and Affect: Mood normal.        Behavior: Behavior normal.        Thought Content: Thought content normal.     LABS:     Ref. Range 10/01/2020 00:00  Sodium Latest Ref Range: 137 - 147  137  Potassium Latest Ref Range: 3.4 - 5.3  3.9  Chloride Latest Ref Range: 99 - 108  105  CO2 Latest Ref Range: 13 - 22  22  Glucose Unknown 91  BUN Latest Ref Range: 4 - 21  16   Creatinine Latest Ref Range: 0.6 - 1.3  0.9  Calcium Latest Ref Range: 8.7 - 10.7  8.7  Alkaline Phosphatase Latest Ref Range: 25 - 125  64  Albumin Latest Ref Range: 3.5 - 5.0  4.4  AST Latest Ref Range: 14 - 40  44 (A)  ALT Latest Ref Range: 10 - 40  33  Bilirubin, Total Unknown 1.5  LDH Unknown 501  WBC Unknown 7.3  RBC Latest Ref Range: 3.87 - 5.11  4.16  Hemoglobin Latest Ref Range: 13.5 - 17.5  13.7  HCT Latest Ref Range: 41 - 53  40 (A)  Platelets Latest Ref Range: 150 - 399  225  NEUT# Unknown 3.87    ASSESSMENT & PLAN:  Assessment/Plan:  A 60 y.o. male with recurrent mantle cell lymphoma, which includes biopsy-proven evidence of right lower eyelid involvement.  Based upon his physical exam today, it clearly appears he continues to derive a clinical benefit from his treatment as his left preauricular lymphadenopathy and right eyelid fullness have essentially dissipated.  He knows to continue taking ibrutinib 560 mg daily.  Rituxan will now be spaced out to once every 4 weeks, with his next dose to be given tomorrow.  I will see him back in 4 weeks for repeat clinical assessment.  A PET scan will be done before his next visit to ascertain his new disease baseline after 3 cycles of ibrutinib/Rituxan.  The patient understands all the plans discussed today and is in agreement with them.    Aida Lemaire Macarthur Critchley, MD

## 2020-10-01 ENCOUNTER — Telehealth: Payer: Self-pay | Admitting: Oncology

## 2020-10-01 ENCOUNTER — Other Ambulatory Visit: Payer: Self-pay

## 2020-10-01 ENCOUNTER — Inpatient Hospital Stay: Payer: Medicare Other | Attending: Oncology

## 2020-10-01 ENCOUNTER — Other Ambulatory Visit: Payer: Self-pay | Admitting: Hematology and Oncology

## 2020-10-01 ENCOUNTER — Other Ambulatory Visit: Payer: Self-pay | Admitting: Oncology

## 2020-10-01 ENCOUNTER — Inpatient Hospital Stay (INDEPENDENT_AMBULATORY_CARE_PROVIDER_SITE_OTHER): Payer: Medicare Other | Admitting: Oncology

## 2020-10-01 VITALS — BP 144/82 | HR 64 | Temp 98.0°F | Resp 16 | Ht 69.0 in | Wt 215.2 lb

## 2020-10-01 DIAGNOSIS — C8310 Mantle cell lymphoma, unspecified site: Secondary | ICD-10-CM

## 2020-10-01 DIAGNOSIS — C8319 Mantle cell lymphoma, extranodal and solid organ sites: Secondary | ICD-10-CM

## 2020-10-01 DIAGNOSIS — Z5112 Encounter for antineoplastic immunotherapy: Secondary | ICD-10-CM | POA: Insufficient documentation

## 2020-10-01 LAB — BASIC METABOLIC PANEL
BUN: 16 (ref 4–21)
CO2: 22 (ref 13–22)
Chloride: 105 (ref 99–108)
Creatinine: 0.9 (ref 0.6–1.3)
Glucose: 91
Potassium: 3.9 (ref 3.4–5.3)
Sodium: 137 (ref 137–147)

## 2020-10-01 LAB — COMPREHENSIVE METABOLIC PANEL
Albumin: 4.4 (ref 3.5–5.0)
Calcium: 8.7 (ref 8.7–10.7)

## 2020-10-01 LAB — HEPATIC FUNCTION PANEL
ALT: 33 (ref 10–40)
AST: 44 — AB (ref 14–40)
Alkaline Phosphatase: 64 (ref 25–125)
Bilirubin, Total: 1.5

## 2020-10-01 LAB — CBC AND DIFFERENTIAL
HCT: 40 — AB (ref 41–53)
Hemoglobin: 13.7 (ref 13.5–17.5)
Neutrophils Absolute: 3.87
Platelets: 225 (ref 150–399)
WBC: 7.3

## 2020-10-01 LAB — LACTATE DEHYDROGENASE: LDH: 501

## 2020-10-01 LAB — CBC: RBC: 4.16 (ref 3.87–5.11)

## 2020-10-01 NOTE — Telephone Encounter (Signed)
Per 3/2 LOS, patient scheduled for 3/28 Labs, PET Scan - 3/29 Follow Up Appt  Patient stated he would check his [place] Orders/Instruction for PET Scan

## 2020-10-02 ENCOUNTER — Inpatient Hospital Stay: Payer: Medicare Other

## 2020-10-02 VITALS — BP 109/67 | HR 59 | Temp 98.2°F | Resp 18 | Ht 69.0 in | Wt 212.8 lb

## 2020-10-02 DIAGNOSIS — C8310 Mantle cell lymphoma, unspecified site: Secondary | ICD-10-CM

## 2020-10-02 DIAGNOSIS — Z5112 Encounter for antineoplastic immunotherapy: Secondary | ICD-10-CM | POA: Diagnosis present

## 2020-10-02 MED ORDER — DIPHENHYDRAMINE HCL 25 MG PO CAPS
50.0000 mg | ORAL_CAPSULE | Freq: Once | ORAL | Status: AC
Start: 2020-10-02 — End: 2020-10-02
  Administered 2020-10-02: 50 mg via ORAL

## 2020-10-02 MED ORDER — SODIUM CHLORIDE 0.9 % IV SOLN
375.0000 mg/m2 | Freq: Once | INTRAVENOUS | Status: AC
  Administered 2020-10-02: 800 mg via INTRAVENOUS
  Filled 2020-10-02: qty 50

## 2020-10-02 MED ORDER — DIPHENHYDRAMINE HCL 25 MG PO CAPS
ORAL_CAPSULE | ORAL | Status: AC
  Filled 2020-10-02: qty 2

## 2020-10-02 MED ORDER — ACETAMINOPHEN 325 MG PO TABS
650.0000 mg | ORAL_TABLET | Freq: Once | ORAL | Status: AC
  Administered 2020-10-02: 650 mg via ORAL

## 2020-10-02 MED ORDER — ACETAMINOPHEN 325 MG PO TABS
ORAL_TABLET | ORAL | Status: AC
  Filled 2020-10-02: qty 2

## 2020-10-02 MED ORDER — SODIUM CHLORIDE 0.9 % IV SOLN
Freq: Once | INTRAVENOUS | Status: AC
  Filled 2020-10-02: qty 250

## 2020-10-02 NOTE — Patient Instructions (Signed)
Rituximab Injection What is this medicine? RITUXIMAB (ri TUX i mab) is a monoclonal antibody. It is used to treat certain types of cancer like non-Hodgkin lymphoma and chronic lymphocytic leukemia. It is also used to treat rheumatoid arthritis, granulomatosis with polyangiitis, microscopic polyangiitis, and pemphigus vulgaris. This medicine may be used for other purposes; ask your health care provider or pharmacist if you have questions. COMMON BRAND NAME(S): RIABNI, Rituxan, RUXIENCE What should I tell my health care provider before I take this medicine? They need to know if you have any of these conditions:  chest pain  heart disease  infection especially a viral infection such as chickenpox, cold sores, hepatitis B, or herpes  immune system problems  irregular heartbeat or rhythm  kidney disease  low blood counts (white cells, platelets, or red cells)  lung disease  recent or upcoming vaccine  an unusual or allergic reaction to rituximab, other medicines, foods, dyes, or preservatives  pregnant or trying to get pregnant  breast-feeding How should I use this medicine? This medicine is injected into a vein. It is given by a health care provider in a hospital or clinic setting. A special MedGuide will be given to you before each treatment. Be sure to read this information carefully each time. Talk to your health care provider about the use of this medicine in children. While this drug may be prescribed for children as young as 2 years for selected conditions, precautions do apply. Overdosage: If you think you have taken too much of this medicine contact a poison control center or emergency room at once. NOTE: This medicine is only for you. Do not share this medicine with others. What if I miss a dose? Keep appointments for follow-up doses. It is important not to miss your dose. Call your health care provider if you are unable to keep an appointment. What may interact with this  medicine? Do not take this medicine with any of the following medicines:  live vaccines This medicine may also interact with the following medicines:  cisplatin This list may not describe all possible interactions. Give your health care provider a list of all the medicines, herbs, non-prescription drugs, or dietary supplements you use. Also tell them if you smoke, drink alcohol, or use illegal drugs. Some items may interact with your medicine. What should I watch for while using this medicine? Your condition will be monitored carefully while you are receiving this medicine. You may need blood work done while you are taking this medicine. This medicine can cause serious infusion reactions. To reduce the risk your health care provider may give you other medicines to take before receiving this one. Be sure to follow the directions from your health care provider. This medicine may increase your risk of getting an infection. Call your health care provider for advice if you get a fever, chills, sore throat, or other symptoms of a cold or flu. Do not treat yourself. Try to avoid being around people who are sick. Call your health care provider if you are around anyone with measles, chickenpox, or if you develop sores or blisters that do not heal properly. Avoid taking medicines that contain aspirin, acetaminophen, ibuprofen, naproxen, or ketoprofen unless instructed by your health care provider. These medicines may hide a fever. This medicine may cause serious skin reactions. They can happen weeks to months after starting the medicine. Contact your health care provider right away if you notice fevers or flu-like symptoms with a rash. The rash may be red   or purple and then turn into blisters or peeling of the skin. Or, you might notice a red rash with swelling of the face, lips or lymph nodes in your neck or under your arms. In some patients, this medicine may cause a serious brain infection that may cause  death. If you have any problems seeing, thinking, speaking, walking, or standing, tell your healthcare professional right away. If you cannot reach your healthcare professional, urgently seek other source of medical care. Do not become pregnant while taking this medicine or for at least 12 months after stopping it. Women should inform their health care provider if they wish to become pregnant or think they might be pregnant. There is potential for serious harm to an unborn child. Talk to your health care provider for more information. Women should use a reliable form of birth control while taking this medicine and for 12 months after stopping it. Do not breast-feed while taking this medicine or for at least 6 months after stopping it. What side effects may I notice from receiving this medicine? Side effects that you should report to your health care provider as soon as possible:  allergic reactions (skin rash, itching or hives; swelling of the face, lips, or tongue)  diarrhea  edema (sudden weight gain; swelling of the ankles, feet, hands or other unusual swelling; trouble breathing)  fast, irregular heartbeat  heart attack (trouble breathing; pain or tightness in the chest, neck, back or arms; unusually weak or tired)  infection (fever, chills, cough, sore throat, pain or trouble passing urine)  kidney injury (trouble passing urine or change in the amount of urine)  liver injury (dark yellow or brown urine; general ill feeling or flu-like symptoms; loss of appetite, right upper belly pain; unusually weak or tired, yellowing of the eyes or skin)  low blood pressure (dizziness; feeling faint or lightheaded, falls; unusually weak or tired)  low red blood cell counts (trouble breathing; feeling faint; lightheaded, falls; unusually weak or tired)  mouth sores  redness, blistering, peeling, or loosening of the skin, including inside the mouth  stomach pain  unusual bruising or  bleeding  wheezing (trouble breathing with loud or whistling sounds)  vomiting Side effects that usually do not require medical attention (report to your health care provider if they continue or are bothersome):  headache  joint pain  muscle cramps, pain  nausea This list may not describe all possible side effects. Call your doctor for medical advice about side effects. You may report side effects to FDA at 1-800-FDA-1088. Where should I keep my medicine? This medicine is given in a hospital or clinic. It will not be stored at home. NOTE: This sheet is a summary. It may not cover all possible information. If you have questions about this medicine, talk to your doctor, pharmacist, or health care provider.  2021 Elsevier/Gold Standard (2020-05-01 21:35:50)

## 2020-10-09 ENCOUNTER — Telehealth: Payer: Self-pay | Admitting: Oncology

## 2020-10-09 NOTE — Telephone Encounter (Signed)
Patient requested to CX 3/28 PET Scan in [place] to [facility] due to results being on MyChart  PET Scan scheduled at [facility] on 3/28 to check in at 8 am.  He is to come by here after for Labs  Patient has been notified

## 2020-10-13 ENCOUNTER — Telehealth: Payer: Self-pay

## 2020-10-13 NOTE — Telephone Encounter (Signed)
I spoke with pt to see how he has been doing taking the [place]. Pt states he still has the rash, but is using the cortisone cream. He states he was told he could take an antihistamine @ night if he wanted. He denies N/V, diarrhea, & fevers. No missed doses. He is taking the Imbruvica @ 6ish every morning with cup of coffee. Reminded pt to call us if temp over 100.4. Confirmed next appt's. Pt verbalized understanding.

## 2020-10-14 ENCOUNTER — Encounter: Payer: Self-pay | Admitting: Oncology

## 2020-10-22 ENCOUNTER — Ambulatory Visit: Payer: Medicare Other | Admitting: Hematology and Oncology

## 2020-10-27 ENCOUNTER — Inpatient Hospital Stay: Payer: Medicare Other

## 2020-10-27 ENCOUNTER — Other Ambulatory Visit: Payer: Self-pay | Admitting: Hematology and Oncology

## 2020-10-27 ENCOUNTER — Other Ambulatory Visit: Payer: Self-pay

## 2020-10-27 DIAGNOSIS — C8310 Mantle cell lymphoma, unspecified site: Secondary | ICD-10-CM

## 2020-10-27 DIAGNOSIS — Z5112 Encounter for antineoplastic immunotherapy: Secondary | ICD-10-CM | POA: Diagnosis not present

## 2020-10-27 LAB — CBC
MCV: 98 — AB (ref 80–96)
RBC: 4.36 (ref 3.87–5.11)

## 2020-10-27 LAB — CBC AND DIFFERENTIAL
HCT: 43 (ref 41–53)
Hemoglobin: 14.2 (ref 13.5–17.5)
Neutrophils Absolute: 2.81
Platelets: 296 (ref 150–399)
WBC: 5.5

## 2020-10-27 LAB — COMPREHENSIVE METABOLIC PANEL
Albumin: 4.3 (ref 3.5–5.0)
Calcium: 8.8 (ref 8.7–10.7)

## 2020-10-27 LAB — BASIC METABOLIC PANEL
BUN: 20 (ref 4–21)
CO2: 24 — AB (ref 13–22)
Chloride: 109 — AB (ref 99–108)
Creatinine: 0.9 (ref 0.6–1.3)
Glucose: 83
Potassium: 4.1 (ref 3.4–5.3)
Sodium: 139 (ref 137–147)

## 2020-10-27 LAB — HEPATIC FUNCTION PANEL
ALT: 21 (ref 10–40)
AST: 24 (ref 14–40)
Alkaline Phosphatase: 63 (ref 25–125)
Bilirubin, Total: 0.3

## 2020-10-27 LAB — LACTATE DEHYDROGENASE: LDH: 164 U/L (ref 98–192)

## 2020-10-27 NOTE — Progress Notes (Addendum)
[facility]  [address] [place],  [place]  [address] [phone]  Clinic Day:  10/28/2020  HISTORY OF PRESENT ILLNESS:  The patient is a 60 y.o. 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  He comes in today to go over his PET scan to ascertain his new disease baseline after being on ibrutinib/Rituxan for the past 3 months.  Overall, the patient has been doing well.  He has definitely noticed an improvement in his known areas of disease since this combination regimen has been started.  One problem he has run into are areas of apparent folliculitis in his beard/neck region, which have been an issue since his current therapy was started.    Of note, this patient has been dealing with mantle cell lymphoma since 2011.  He completed palliative radiation to a left parietal scalp lesion that was biopsy proven in July 2021 to be recurrent mantle cell lymphoma.  In early 2021, the patient received radiation to his tonsillar/cervical regions as a PET scan done showed hypermetabolic disease in these areas. Prior systemic therapies he has received over the past 10 years have included R-hyper-CVAD, BEAM chemotherapy followed by an autologous stem cell rescue, and bendamustine/Rituxan.   PHYSICAL EXAM:  Blood pressure (!) 152/77, pulse 60, temperature 98.3 F (36.8 C), resp. rate 18, height 5\' 9"  (1.753 m), weight 222 lb 8 oz (100.9 kg), SpO2 96 %. Wt Readings from Last 3 Encounters:  10/28/20 222 lb 8 oz (100.9 kg)  10/02/20 212 lb 12 oz (96.5 kg)  10/01/20 215 lb 3.2 oz (97.6 kg)   Body mass index is 32.86 kg/m. Performance status (ECOG): 0 Physical Exam Constitutional:      General: He is not in acute distress.    Appearance: Normal appearance. He is normal weight. He is not ill-appearing.  HENT:     Mouth/Throat:     Mouth: Mucous membranes are moist.     Pharynx: Oropharynx is clear. No oropharyngeal exudate or posterior oropharyngeal erythema.  Eyes:      General:        Right eye: No foreign body (No further bulkiness appreciated in medial left eye ).  Cardiovascular:     Rate and Rhythm: Normal rate and regular rhythm.     Heart sounds: No murmur heard. No friction rub. No gallop.   Pulmonary:     Effort: Pulmonary effort is normal. No respiratory distress.     Breath sounds: Normal breath sounds. No wheezing, rhonchi or rales.  Chest:  Breasts:     Right: No axillary adenopathy or supraclavicular adenopathy.     Left: No axillary adenopathy or supraclavicular adenopathy (left preauricalar lympadenopathy has completely dissipated).    Abdominal:     General: Bowel sounds are normal. There is no distension.     Palpations: Abdomen is soft. There is no mass.     Tenderness: There is no abdominal tenderness.  Musculoskeletal:        General: No swelling.     Right lower leg: No edema.     Left lower leg: No edema.  Lymphadenopathy:     Cervical: No cervical adenopathy.     Upper Body:     Right upper body: No supraclavicular or axillary adenopathy.     Left upper body: No supraclavicular (left preauricalar lympadenopathy has completely dissipated) or axillary adenopathy.     Lower Body: No right inguinal adenopathy. No left inguinal adenopathy.  Skin:    General: Skin is  warm.     Coloration: Skin is not jaundiced.     Findings: No lesion or rash.  Neurological:     General: No focal deficit present.     Mental Status: He is alert and oriented to person, place, and time. Mental status is at baseline.     Cranial Nerves: Cranial nerves are intact. No cranial nerve deficit.     Motor: No weakness.  Psychiatric:        Mood and Affect: Mood normal.        Behavior: Behavior normal.        Thought Content: Thought content normal.    SCANS:  Her PET scan revealed the following: FINDINGS: Mediastinal blood pool activity: SUV max 1.4  Liver activity: SUV max 2.3  NECK:  The previously identified hypermetabolic tumor along  the medial wall of the right orbit has improved. Soft tissue thickening in this area has decreased in size, now measuring 2.3 x 0.9 cm (previously 3.0 x 1.2 cm). Remaining metabolic activity posteriorly in both orbits is nearly symmetric and within physiologic limits. The previously identified hypermetabolic lymph nodes around the left parotid gland and inferiorly in the lower neck have resolved.There are no lesions of the pharyngeal mucosal space.  Incidental CT findings: none  CHEST:  There are no hypermetabolic mediastinal, hilar or axillary lymph nodes. There is no hypermetabolic pulmonary activity or suspicious pulmonary nodularity. Minimal bilateral pulmonary nodularity appears unchanged.  Incidental CT findings: none  ABDOMEN/PELVIS:  There is no hypermetabolic activity within the liver, adrenal glands, spleen or pancreas. There is no hypermetabolic nodal activity. Previous splenectomy with stable small splenule without hypermetabolic activity.  Incidental CT findings: Prior cholecystectomy.  SKELETON:  There is no hypermetabolic activity to suggest osseous metastatic disease.  Incidental CT findings: none  IMPRESSION: 1. Interval resolution of previously demonstrated hypermetabolic cervical lymph nodes and right tonsillar activity. [place] 1. 2. The hypermetabolic mass previously seen medially in the right orbit has decreased in size and demonstrates no residual hypermetabolic activity. 3. No significant findings in the chest, abdomen or pelvis.   LABS:     Ref. Range 10/27/2020 00:00 10/27/2020 11:15  Sodium Latest Ref Range: 137 - 147  139   Potassium Latest Ref Range: 3.4 - 5.3  4.1   Chloride Latest Ref Range: 99 - 108  109 (A)   CO2 Latest Ref Range: 13 - 22  24 (A)   Glucose Unknown 83   BUN Latest Ref Range: 4 - 21  20   Creatinine Latest Ref Range: 0.6 - 1.3  0.9   Calcium Latest Ref Range: 8.7 - 10.7  8.8   Alkaline Phosphatase Latest Ref  Range: 25 - 125  63   Albumin Latest Ref Range: 3.5 - 5.0  4.3   AST Latest Ref Range: 14 - 40  24   ALT Latest Ref Range: 10 - 40  21   Bilirubin, Total Unknown 0.3   LDH Latest Ref Range: 98 - 192 U/L  164  CBC W DIFFERENTIAL ([facility] SCANNED REPORT) Unknown  Rpt  WBC Unknown 5.5   RBC Latest Ref Range: 3.87 - 5.11  4.36   Hemoglobin Latest Ref Range: 13.5 - 17.5  14.2   HCT Latest Ref Range: 41 - 53  43   MCV Latest Ref Range: 80 - 96  98 (A)   Platelets Latest Ref Range: 150 - 399  296   NEUT# Unknown 2.81      ASSESSMENT &  PLAN:  Assessment/Plan:  A 60 y.o. male with recurrent mantle cell lymphoma, which includes biopsy-proven evidence of right lower eyelid involvement.  In clinic today, I went over his PET scan images with him, for which he could see that he has had a complete response after just 3 cycles of ibrutinib/Rituxan.  Although he was pleased with his radiographic complete response, the patient wishes to stop his ibrutinib, partly based upon the skin lesions he is having.  I did prescribe him levaquin 750 mg QD x 7 days to address the skin infections seen.  I also talked to him about checking quantitative immunoglobulins to make sure he has not developed disease and/or treatment-related hypogammaglobulinemia, which can lead to recurrent skin infections like his.  If present, I would incorporate IVIG into his therapy.  However, the main reason the patient wishes to stop his therapy is because he does not want his disease to develop resistance to it.  This line of thought was peculiar to me because his disease has already been exposed to Rituxan twice over these past years, which would suggest his disease has formed resistance to this agent already - yet he pushed for it to be given for a 3rd time, with his ibrutinib.  Another issue this patient has been adamant about is having MRD testing done to determine if his disease is still present at a microscopic level in his body.  What  concerns me about his line of thinking on MRD are multiple issues.  For one, this gentleman was pushing for MRD testing to be done at his first visit when he had florid disease present, per both physical exam and scans, and before his ibrutinib/Rituxan was even started, which would have clearly obviated the need for such testing to be done at that time.  Furthermore, he understands MRD testing is not standard-of-care, particularly in the setting of mantle cell lymphoma.  Additionally, this gentleman is already wanting to stop ibrutinib/Rituxan after just 3 cycles.  Based upon this, my question to him was what MRD level in the future would trigger him to think about restarting therapy, particularly when no standard guidelines tell a physician at what level re-initiation of therapy should occur.  I also told him that even though his PET scan was negative, the likelihood of his MRD testing being negative after just 3 cycles of ibrutinib/Rituxan is extremely low.  The major point I have tried to explain to him since his 1st visit is that MRD testing, particularly in the mantle cell lymphoma setting, is simply not standard-of-care.  If there was a clinical trial in the area evaluating this, I would be more than glad to refer him to that local institution conducting such studies.  Unfortunately, this visit was unnecessarily contentious, particularly when considering this gentleman got excellent news regarding his PET scan.  I encouraged this gentleman to get a 2nd opinion regarding his questions about MRD testing and his overall mantle cell lymphoma care.  I gave him my business card and instructed him to tell the physician doing his 2nd opinion to call me concerning how his/her office routinely does MRD testing for mantle cell lymphoma, from which I could learn and begin applying here.  For now this patient is tentatively scheduled to see me back in 4 weeks.    Adelin Ventrella Macarthur Critchley, MD

## 2020-10-28 ENCOUNTER — Telehealth: Payer: Self-pay | Admitting: Oncology

## 2020-10-28 ENCOUNTER — Inpatient Hospital Stay (INDEPENDENT_AMBULATORY_CARE_PROVIDER_SITE_OTHER): Payer: Medicare Other | Admitting: Oncology

## 2020-10-28 ENCOUNTER — Other Ambulatory Visit: Payer: Self-pay | Admitting: Oncology

## 2020-10-28 VITALS — BP 152/77 | HR 60 | Temp 98.3°F | Resp 18 | Ht 69.0 in | Wt 222.5 lb

## 2020-10-28 DIAGNOSIS — C8311 Mantle cell lymphoma, lymph nodes of head, face, and neck: Secondary | ICD-10-CM | POA: Diagnosis not present

## 2020-10-28 MED ORDER — LEVOFLOXACIN 750 MG PO TABS: 750.0000 mg | ORAL_TABLET | Freq: Every day | ORAL | 0 refills | Status: DC

## 2020-10-28 NOTE — Telephone Encounter (Signed)
Per 3/29 los next appt scheduled and given to patient 

## 2020-10-30 ENCOUNTER — Ambulatory Visit: Payer: Medicare Other

## 2020-10-31 MED FILL — Rituximab-pvvr IV Soln 500 MG/50ML (10 MG/ML): INTRAVENOUS | Qty: 80 | Status: AC

## 2020-10-31 NOTE — Progress Notes (Signed)
I spoke with the patient today via phone and he wanted to cancel his rituximab appt for Monday 4/4.  He plans to get a second opinion and will see Dr. Bobby Rumpf later this month and decide whether to continue rituximab and ibrutinib treatment.

## 2020-11-03 ENCOUNTER — Inpatient Hospital Stay: Payer: Medicare Other

## 2020-11-24 NOTE — Progress Notes (Incomplete)
[facility]  [address] [place],  [place]  [address] [phone]  Clinic Day:  11/24/2020  HISTORY OF PRESENT ILLNESS:  The patient is a 60 y.o. 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  He comes in today to e evaluated after being on ibrutinib/Rituxan for the past 4 months.  Overall, the patient has been doing well.  He  Of note, this patient has been dealing with mantle cell lymphoma since 2011.  He completed palliative radiation to a left parietal scalp lesion that was biopsy proven in July 2021 to be recurrent mantle cell lymphoma.  In early 2021, the patient received radiation to his tonsillar/cervical regions as a PET scan done showed hypermetabolic disease in these areas. Prior systemic therapies he has received over the past 10 years have included R-hyper-CVAD, BEAM chemotherapy followed by an autologous stem cell rescue, and bendamustine/Rituxan.   PHYSICAL EXAM:  There were no vitals taken for this visit. Wt Readings from Last 3 Encounters:  10/28/20 222 lb 8 oz (100.9 kg)  10/02/20 212 lb 12 oz (96.5 kg)  10/01/20 215 lb 3.2 oz (97.6 kg)   There is no height or weight on file to calculate BMI. Performance status (ECOG): 0 Physical Exam Constitutional:      General: He is not in acute distress.    Appearance: Normal appearance. He is normal weight. He is not ill-appearing.  HENT:     Mouth/Throat:     Mouth: Mucous membranes are moist.     Pharynx: Oropharynx is clear. No oropharyngeal exudate or posterior oropharyngeal erythema.  Eyes:     General:        Right eye: No foreign body (No further bulkiness appreciated in medial left eye ).  Cardiovascular:     Rate and Rhythm: Normal rate and regular rhythm.     Heart sounds: No murmur heard. No friction rub. No gallop.   Pulmonary:     Effort: Pulmonary effort is normal. No respiratory distress.     Breath sounds: Normal breath sounds. No wheezing, rhonchi or rales.  Chest:   Breasts:     Right: No axillary adenopathy or supraclavicular adenopathy.     Left: No axillary adenopathy or supraclavicular adenopathy (left preauricalar lympadenopathy has completely dissipated).    Abdominal:     General: Bowel sounds are normal. There is no distension.     Palpations: Abdomen is soft. There is no mass.     Tenderness: There is no abdominal tenderness.  Musculoskeletal:        General: No swelling.     Right lower leg: No edema.     Left lower leg: No edema.  Lymphadenopathy:     Cervical: No cervical adenopathy.     Upper Body:     Right upper body: No supraclavicular or axillary adenopathy.     Left upper body: No supraclavicular (left preauricalar lympadenopathy has completely dissipated) or axillary adenopathy.     Lower Body: No right inguinal adenopathy. No left inguinal adenopathy.  Skin:    General: Skin is warm.     Coloration: Skin is not jaundiced.     Findings: No lesion or rash.  Neurological:     General: No focal deficit present.     Mental Status: He is alert and oriented to person, place, and time. Mental status is at baseline.     Cranial Nerves: Cranial nerves are intact. No cranial nerve deficit.     Motor: No weakness.  Psychiatric:        Mood and Affect: Mood normal.        Behavior: Behavior normal.        Thought Content: Thought content normal.    LABS:     Ref. Range 10/27/2020 00:00 10/27/2020 11:15  Sodium Latest Ref Range: 137 - 147  139   Potassium Latest Ref Range: 3.4 - 5.3  4.1   Chloride Latest Ref Range: 99 - 108  109 (A)   CO2 Latest Ref Range: 13 - 22  24 (A)   Glucose Unknown 83   BUN Latest Ref Range: 4 - 21  20   Creatinine Latest Ref Range: 0.6 - 1.3  0.9   Calcium Latest Ref Range: 8.7 - 10.7  8.8   Alkaline Phosphatase Latest Ref Range: 25 - 125  63   Albumin Latest Ref Range: 3.5 - 5.0  4.3   AST Latest Ref Range: 14 - 40  24   ALT Latest Ref Range: 10 - 40  21   Bilirubin, Total Unknown 0.3   LDH  Latest Ref Range: 98 - 192 U/L  164  CBC W DIFFERENTIAL ([facility] SCANNED REPORT) Unknown  Rpt  WBC Unknown 5.5   RBC Latest Ref Range: 3.87 - 5.11  4.36   Hemoglobin Latest Ref Range: 13.5 - 17.5  14.2   HCT Latest Ref Range: 41 - 53  43   MCV Latest Ref Range: 80 - 96  98 (A)   Platelets Latest Ref Range: 150 - 399  296   NEUT# Unknown 2.81      ASSESSMENT & PLAN:  Assessment/Plan:  A 60 y.o. 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  He elected to stop taking his ibrutinib.    For now this patient is tentatively scheduled to see me back in 4 weeks.    Dequincy Macarthur Critchley, MD

## 2020-11-25 ENCOUNTER — Inpatient Hospital Stay: Payer: Medicare Other | Attending: Oncology

## 2020-11-25 ENCOUNTER — Inpatient Hospital Stay: Payer: Medicare Other | Admitting: Oncology

## 2020-12-01 ENCOUNTER — Inpatient Hospital Stay: Payer: Medicare Other

## 2020-12-30 ENCOUNTER — Telehealth: Payer: Self-pay | Admitting: Hematology and Oncology

## 2020-12-30 ENCOUNTER — Encounter: Payer: Self-pay | Admitting: Hematology and Oncology

## 2020-12-30 NOTE — Telephone Encounter (Signed)
I received an email from the Director Oncology, to schedule Samuel Crane to established care w/Dr. Lorenso Courier for mantle cell lymphoma. [name] has been cld and scheduled to see Dr. Lorenso Courier on 6/1 at 2pm. Pt aware to arrive 20 minutes early.

## 2020-12-31 ENCOUNTER — Encounter: Payer: Self-pay | Admitting: Hematology and Oncology

## 2020-12-31 ENCOUNTER — Inpatient Hospital Stay: Payer: Medicare Other | Attending: Hematology and Oncology | Admitting: Hematology and Oncology

## 2020-12-31 ENCOUNTER — Other Ambulatory Visit: Payer: Self-pay

## 2020-12-31 ENCOUNTER — Inpatient Hospital Stay: Payer: Medicare Other

## 2020-12-31 VITALS — BP 128/77 | HR 66 | Temp 97.8°F | Resp 18 | Ht 69.0 in | Wt 212.8 lb

## 2020-12-31 DIAGNOSIS — C8310 Mantle cell lymphoma, unspecified site: Secondary | ICD-10-CM

## 2020-12-31 DIAGNOSIS — C8319 Mantle cell lymphoma, extranodal and solid organ sites: Secondary | ICD-10-CM | POA: Diagnosis not present

## 2020-12-31 DIAGNOSIS — C8311 Mantle cell lymphoma, lymph nodes of head, face, and neck: Secondary | ICD-10-CM

## 2020-12-31 LAB — CMP (CANCER CENTER ONLY)
ALT: 13 U/L (ref 0–44)
AST: 16 U/L (ref 15–41)
Albumin: 3.8 g/dL (ref 3.5–5.0)
Alkaline Phosphatase: 66 U/L (ref 38–126)
Anion gap: 12 (ref 5–15)
BUN: 14 mg/dL (ref 6–20)
CO2: 26 mmol/L (ref 22–32)
Calcium: 9.2 mg/dL (ref 8.9–10.3)
Chloride: 106 mmol/L (ref 98–111)
Creatinine: 0.92 mg/dL (ref 0.61–1.24)
GFR, Estimated: >60 mL/min (ref >60)
Glucose, Bld: 82 mg/dL (ref 70–99)
Potassium: 4.4 mmol/L (ref 3.5–5.1)
Sodium: 144 mmol/L (ref 135–145)
Total Bilirubin: 0.3 mg/dL (ref 0.3–1.2)
Total Protein: 7.1 g/dL (ref 6.5–8.1)

## 2020-12-31 LAB — CBC WITH DIFFERENTIAL (CANCER CENTER ONLY)
Abs Immature Granulocytes: 0.05 10*3/uL (ref 0.00–0.07)
Basophils Absolute: 0.1 10*3/uL (ref 0.0–0.1)
Basophils Relative: 1 %
Eosinophils Absolute: 0.1 10*3/uL (ref 0.0–0.5)
Eosinophils Relative: 2 %
HCT: 40.6 % (ref 39.0–52.0)
Hemoglobin: 13.9 g/dL (ref 13.0–17.0)
Immature Granulocytes: 1 %
Lymphocytes Relative: 21 %
Lymphs Abs: 1.7 10*3/uL (ref 0.7–4.0)
MCH: 32.8 pg (ref 26.0–34.0)
MCHC: 34.2 g/dL (ref 30.0–36.0)
MCV: 95.8 fL (ref 80.0–100.0)
Monocytes Absolute: 1.1 10*3/uL — ABNORMAL HIGH (ref 0.1–1.0)
Monocytes Relative: 14 %
Neutro Abs: 5.1 10*3/uL (ref 1.7–7.7)
Neutrophils Relative %: 61 %
Platelet Count: 303 10*3/uL (ref 150–400)
RBC: 4.24 MIL/uL (ref 4.22–5.81)
RDW: 14.4 % (ref 11.5–15.5)
WBC Count: 8.2 10*3/uL (ref 4.0–10.5)
nRBC: 0 % (ref 0.0–0.2)

## 2020-12-31 LAB — LACTATE DEHYDROGENASE: LDH: 180 U/L (ref 98–192)

## 2020-12-31 NOTE — Progress Notes (Signed)
[place] [phone]   [phone]  INITIAL CONSULT NOTE  Patient Care Team: Patient, No Pcp Per (Inactive) as PCP - General ([facility]) Gatha Mayer, MD as Consulting Physician (Radiation Oncology) Marice Potter, MD as Consulting Physician (Oncology) Orson Slick, MD as Consulting Physician (Hematology and Oncology)  Hematological/Oncological History # Relapsed Mantle Cell Lymphoma 12/31/2020: presents to [facility] for a second opinion. Patient is s/p 3 Cycle of Ibrutinib/Rituximab.  CHIEF COMPLAINTS/PURPOSE OF CONSULTATION:  "Relapsed Mantle Cell Lymphoma "  HISTORY OF PRESENTING ILLNESS:  Samuel Crane 60 y.o. male with medical history significant for relapsed mantle cell lymphoma who presents for a second opinion.  On review of the previous records Samuel Crane was previously followed by Dr. Bobby Rumpf in [place] at the [facility] cancer center.  The patient has an extensive history of mantle cell lymphoma dating back to 2011.  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  He completed 3 cycles of this followed by a PET CT scan which showed a complete response to therapy.  The patient was requesting MRD/NGS testing and wanted to consider discontinuation of his chemotherapy treatments (per the documentation), however Dr. Bobby Rumpf recommended that he continue with treatment and declined to perform the requested testing.  Due to this gap between the standard of care the provider was offering and the patient's strong opinions the patient requested a second opinion.  He presents today at the [facility] for a second opinion regarding treatment of his relapsed mantle cell lymphoma.  On exam today Samuel Crane reports that he has been having difficulty  with his ibrutinib therapy.  He stopped this therapy after he began developing skin lesions.  He notes that there is a direct correlation between this medication and the skin lesions and therefore he has self discontinued  MEDICAL HISTORY:  Past Medical History:  Diagnosis Date  . Alcohol abuse   . Mantle cell lymphoma ([place])   . Tobacco abuse     SURGICAL HISTORY: History reviewed. No pertinent surgical history.  SOCIAL HISTORY: Social History   Socioeconomic History  . Marital status: Single    Spouse name: Not on file  . Number of children: Not on file  . Years of education: Not on file  . Highest education level: Not on file  Occupational History  . Not on file  Tobacco Use  . Smoking status: Current Every Day Smoker  . Smokeless tobacco: Never Used  Substance and Sexual Activity  . Alcohol use: Not on file  . Drug use: Not on file  . Sexual activity: Not on file  Other Topics Concern  . Not on file  Social History Narrative  . Not on file   Social Determinants of Health   Financial Resource Strain: Not on file  Food Insecurity: Not on file  Transportation Needs: Not on file  Physical Activity: Not on file  Stress: Not on file  Social Connections: Not on file  Intimate Partner Violence: Not on file    FAMILY HISTORY: History reviewed. No pertinent family history.  ALLERGIES:  has no active allergies.  MEDICATIONS:  Current Outpatient Medications  Medication Sig Dispense Refill  . ibrutinib 420 MG TABS Take 420 mg by mouth daily. 30 tablet 2  . acetaminophen (TYLENOL) 325 MG tablet Take by mouth.    Marland Kitchen  celecoxib (CELEBREX) 100 MG capsule Take by mouth.    Mariane Baumgarten Sodium (DSS) 100 MG CAPS Take by mouth. (Patient not taking: Reported on 12/31/2020)    . gabapentin (NEURONTIN) 100 MG capsule Take by mouth.    . hydrocortisone 2.5 % cream Apply topically 2 (two) times daily. To affected areas (Patient not taking: Reported on 12/31/2020) 30 g 1  . naproxen  sodium (ALEVE) 220 MG tablet Take by mouth.    . nicotine (NICODERM CQ - DOSED IN MG/24 HOURS) 21 mg/24hr patch Place 21 mg onto the skin daily. (Patient not taking: Reported on 12/31/2020)    . omeprazole (PRILOSEC) 20 MG capsule Take 1 capsule (20 mg total) by mouth daily. 90 capsule 0  . ondansetron (ZOFRAN) 4 MG tablet Take 1 tablet (4 mg total) by mouth every 4 (four) hours as needed for nausea or vomiting. 90 tablet 1  . prochlorperazine (COMPAZINE) 10 MG tablet Take 1 tablet (10 mg total) by mouth every 6 (six) hours as needed for nausea or vomiting. 90 tablet 3   No current facility-administered medications for this visit.    REVIEW OF SYSTEMS:   Constitutional: ( - ) fevers, ( - )  chills , ( - ) night sweats Eyes: ( - ) blurriness of vision, ( - ) double vision, ( - ) watery eyes Ears, nose, mouth, throat, and face: ( - ) mucositis, ( - ) sore throat Respiratory: ( - ) cough, ( - ) dyspnea, ( - ) wheezes Cardiovascular: ( - ) palpitation, ( - ) chest discomfort, ( - ) lower extremity swelling Gastrointestinal:  ( - ) nausea, ( - ) heartburn, ( - ) change in bowel habits Skin: ( - ) abnormal skin rashes Lymphatics: ( - ) new lymphadenopathy, ( - ) easy bruising Neurological: ( - ) numbness, ( - ) tingling, ( - ) new weaknesses Behavioral/Psych: ( - ) mood change, ( - ) new changes  All other systems were reviewed with the patient and are negative.  PHYSICAL EXAMINATION: ECOG PERFORMANCE STATUS: 0 - Asymptomatic  Vitals:   12/31/20 1417  BP: 128/77  Pulse: 66  Resp: 18  Temp: 97.8 F (36.6 C)  SpO2: 99%   Filed Weights   12/31/20 1417  Weight: 212 lb 12.8 oz (96.5 kg)    GENERAL: well appearing middle aged Caucasian male in NAD  SKIN: skin color, texture, turgor are normal, no rashes or significant lesions EYES: conjunctiva are pink and non-injected, sclera clear LUNGS: clear to auscultation and percussion with normal breathing effort HEART: regular rate & rhythm and  no murmurs and no lower extremity edema Musculoskeletal: no cyanosis of digits and no clubbing  PSYCH: alert & oriented x 3, fluent speech NEURO: no focal motor/sensory deficits  LABORATORY DATA:  I have reviewed the data as listed CBC Latest Ref Rng & Units 12/31/2020 10/27/2020 10/01/2020  WBC 4.0 - 10.5 K/uL 8.2 5.5 7.3  Hemoglobin 13.0 - 17.0 g/dL 13.9 14.2 13.7  Hematocrit 39.0 - 52.0 % 40.6 43 40(A)  Platelets 150 - 400 K/uL 303 296 225    CMP Latest Ref Rng & Units 12/31/2020 10/27/2020 10/01/2020  Glucose 70 - 99 mg/dL 82 - -  BUN 6 - 20 mg/dL 14 20 16   Creatinine 0.61 - 1.24 mg/dL 0.92 0.9 0.9  Sodium 135 - 145 mmol/L 144 139 137  Potassium 3.5 - 5.1 mmol/L 4.4 4.1 3.9  Chloride 98 - 111 mmol/L 106 109(A) 105  CO2  22 - 32 mmol/L 26 24(A) 22  Calcium 8.9 - 10.3 mg/dL 9.2 8.8 8.7  Total Protein 6.5 - 8.1 g/dL 7.1 - -  Total Bilirubin 0.3 - 1.2 mg/dL 0.3 - -  Alkaline Phos 38 - 126 U/L 66 63 64  AST 15 - 41 U/L 16 24 44(A)  ALT 0 - 44 U/L 13 21 33   RADIOGRAPHIC STUDIES: No results found.  ASSESSMENT & PLAN Samuel Crane 60 y.o. male with medical history significant for relapsed mantle cell lymphoma who presents for a second opinion.  After review of the labs, review of the records, and discussion with the patient the patients findings are most consistent with an excellent response to ibrutinib and rituximab therapy for a relapsed mantle cell lymphoma.  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 The patient had a falling out with his last Oncology provider over concerns regarding NGS testing.  Today unfortunately the bulk of our discussion focused on NGS testing.  The patient is perseverating on this testing due to misconceptions about the utility of this  technology. My attempts to improve his understanding were poorly received.  I was clear and firm with the patient that NGS testing is not indicated for him in his current clinical situation.  The patient does not have active disease and is currently in a complete remission.  I will continue the care of this patient with evidence-based treatment based on guidelines and professional judgment.  NGS testing is not indicated and therefore I declined to order this testing.  My decision on this matter is firm and we will not discuss or debate this at further clinical visits. If he were to want this continue to ask for this testing my recommendation would be a referral to an academic medical center of his choosing for that discussion.   # Relapsed Mantle Cell Lymphoma --patient has responded well to ibrutinib/rituximab therapy, having achieved a CR based on PET CT scan from March 2022 after 3 cycles of Ibrutinib/Rituximab therapy.  --agree with continuation of this therapy given how effective it has been for this patient.  --recommend continuing Ibrutinib/Rituximab x 6 cycles followed by q 8 monthly maintenance rituximab x 2 years. (Lancet Oncol. 2016 Jan;17(1):48-56) -- due to outbreak of skin rash will lower dosage of ibrutinib therapy to 420 mg PO daily.  --will plan to start rituximab 375mg /m2 q 4 weeks as soon as is feasible.  --RTC with next dose of rituximab  #Patient Request for NGS Testing -- I have been firm and clear with the patient that NGS testing is not indicated in his current clinical scenario. We will not be ordering this testing.  --if the patient wanted to receive NGS testing of this nature we could offer him referral to any academic center of his choosing for discussion on the feasibility/practicality of this testing.  --We will not discuss this topic further in our clinic visits.   Orders Placed This Encounter  Procedures  . CBC with Differential (Cancer Center Only)    Standing  Status:   Future    Standing Expiration Date:   12/31/2021  . CMP ([place] only)    Standing Status:   Future    Standing Expiration Date:   12/31/2021  . Lactate dehydrogenase (LDH)    Standing Status:   Future  Number of Occurrences:   1    Standing Expiration Date:   12/31/2021  . QIG  (Quant. immunoglobulins  - IgG, IgA, IgM)    Standing Status:   Future    Number of Occurrences:   1    Standing Expiration Date:   12/31/2021    All questions were answered. The patient knows to call the clinic with any problems, questions or concerns.  A total of more than 40 minutes were spent on this encounter with face-to-face time and non-face-to-face time, including preparing to see the patient, ordering tests and/or medications, counseling the patient and coordination of care as outlined above.   Ledell Peoples, MD Department of Hematology/Oncology [place] [facility] Phone: [phone] Pager: [phone] Email: [email or web address]  01/01/2021 5:48 PM   Literature Supportive:  Rise Patience, Wagner-Bartak N, [place] F, [place], [place], [place] F, [place] F, [place] W, [place], [place], [place] D, Lam L, [place] A, Carolynn Comment, Li S, Santos D, [address], [place], [place], Zhang L. Ibrutinib in combination with rituximab in relapsed or refractory mantle cell lymphoma: a single-centre, open-label, phase 2 trial. Lancet Oncol. 2016 Jan;17(1):48-56  --At a median follow-up of 165 months (IQR 8916-9450), 44 (88%, 95% CI 757-955) patients achieved an objective response, with 22 (44%, 300-587) patients achieving a complete response, and 22 (44%, 300-587) a partial response.

## 2021-01-01 ENCOUNTER — Encounter: Payer: Self-pay | Admitting: Hematology and Oncology

## 2021-01-01 ENCOUNTER — Encounter: Payer: Self-pay | Admitting: Oncology

## 2021-01-01 LAB — IGG, IGA, IGM
IgA: 408 mg/dL — ABNORMAL HIGH (ref 90–386)
IgG (Immunoglobin G), Serum: 697 mg/dL (ref 603–1613)
IgM (Immunoglobulin M), Srm: 26 mg/dL (ref 20–172)

## 2021-01-01 MED ORDER — IBRUTINIB 420 MG PO TABS
420.0000 mg | ORAL_TABLET | Freq: Every day | ORAL | 2 refills | Status: DC
  Filled 2021-01-01: qty 56, fill #0
  Filled 2021-01-02: qty 28, 28d supply, fill #0
  Filled 2021-01-26: qty 28, 28d supply, fill #1

## 2021-01-02 ENCOUNTER — Other Ambulatory Visit (HOSPITAL_COMMUNITY): Payer: Self-pay

## 2021-01-02 ENCOUNTER — Encounter: Payer: Self-pay | Admitting: Oncology

## 2021-01-02 ENCOUNTER — Telehealth: Payer: Self-pay | Admitting: Pharmacist

## 2021-01-02 NOTE — Telephone Encounter (Signed)
Oral Chemotherapy Pharmacist Encounter  I spoke with patient for overview of: Imbruvica (ibrutinib) for the treatment of relapsed mantle cell lymphoma in conjunction with rituximab, planned duration of Imbruvica until disease progression or unacceptable toxicity.   Counseled patient on administration, dosing, side effects, monitoring, drug-food interactions, safe handling, storage, and disposal.  Patient will take Imbruvica 420mg  capsules, 1 capsule (420mg ) by mouth once daily.  Patient will take Imbruvica at approximately the same time each day with a full glass of water and maintain adequate hydration throughout the day.  Patient knows to avoid grapefruit or grapefruit juice while on therapy with Imbruvica.  Patient will resume Imbruvica: 01/07/21  Adverse effects include but are not limited to: bruising, decreased blood counts, N/V, diarrhea, musculoskeletal pain, arthralgias, peripheral edema, and hemorrhage.   Patient will obtain anti diarrheal and alert the office of 4 or more loose stools above baseline.  Reviewed with patient importance of keeping a medication schedule and plan for any missed doses. No barriers to medication adherence identified.  Medication reconciliation performed and medication/allergy list updated.  Insurance authorization for [name] has been obtained. Test claim at the pharmacy revealed copayment $0 for fill of Imbruvica. This will ship from the [facility] on 01/05/21 to deliver to patient's home on 01/06/21.  Patient informed the pharmacy will reach out 5-7 days prior to needing next fill of Imbruvica to coordinate continued medication acquisition to prevent break in therapy.  All questions answered.  Samuel Crane voiced understanding and appreciation.   Medication education handout placed in mail for patient. Patient knows to call the office with questions or concerns. Oral Chemotherapy Clinic phone number provided to patient.   Leron Croak, PharmD, BCPS Hematology/Oncology Clinical Pharmacist [facility] [phone] 01/02/2021 11:32 AM

## 2021-01-02 NOTE — Telephone Encounter (Signed)
Oral Oncology Pharmacist Encounter  Received prescription for Imbruvica (ibrutinib) for the continuation of treatment of relapsed mantle cell lymphoma in conjunction with rituximab, planned duration of ibrutinib until disease progression or unacceptable drug toxicity.  Prescription dose and frequency assessed for appropriateness. Dose reduced to 420 mg daily due to rash at higher dose.   CBC w/ Diff and CMP from 12/31/20 assessed, labs stable.  Current medication list in Epic reviewed, DDIs with Imbruvica identified:  Category C DDI between Imbruvica and Naproxen - slight increase risk for bleeding when concomitantly used together. No change in therapy warranted at this time.   Evaluated chart and no patient barriers to medication adherence noted.   Patient agreement for continuation treatment documented in MD note on 12/31/20.  Prescription has been e-scribed to the [facility] for benefits analysis and approval. Patient's copay is $0 for Imbruvica. No prior authorization is required at this time.   Oral Oncology Clinic will continue to follow for initial counseling and start date.  Leron Croak, PharmD, BCPS Hematology/Oncology Clinical Pharmacist [facility] [phone] 01/02/2021 9:39 AM

## 2021-01-05 ENCOUNTER — Encounter: Payer: Self-pay | Admitting: Hematology and Oncology

## 2021-01-05 ENCOUNTER — Other Ambulatory Visit (HOSPITAL_COMMUNITY): Payer: Self-pay

## 2021-01-10 ENCOUNTER — Other Ambulatory Visit: Payer: Self-pay | Admitting: Hematology and Oncology

## 2021-01-12 ENCOUNTER — Encounter: Payer: Self-pay | Admitting: Oncology

## 2021-01-26 ENCOUNTER — Other Ambulatory Visit (HOSPITAL_COMMUNITY): Payer: Self-pay

## 2021-01-29 ENCOUNTER — Ambulatory Visit
Admit: 2021-01-29 | Discharge: 2021-01-30 | Discharge status: Against Medical Advice | Payer: MEDICARE | Attending: Family Medicine

## 2021-02-17 ENCOUNTER — Telehealth: Payer: Self-pay | Admitting: Hematology and Oncology

## 2021-02-17 NOTE — Telephone Encounter (Signed)
Scheduled appts per 7/18 sch msg. Called pt, no answer. Left msg with appts date and times. I let pt know he will come to [facility] first for his lab/MD visit and then go to [facility] for his infusion. I included the address for the [facility] location in the msg and left my phone number for pt to call back with any questions.

## 2021-02-20 ENCOUNTER — Ambulatory Visit: Payer: Medicare Other

## 2021-02-20 ENCOUNTER — Ambulatory Visit: Payer: Medicare Other | Admitting: Hematology and Oncology

## 2021-02-20 ENCOUNTER — Other Ambulatory Visit: Payer: Medicare Other

## 2021-02-25 ENCOUNTER — Other Ambulatory Visit (HOSPITAL_COMMUNITY): Payer: Self-pay

## 2021-02-27 ENCOUNTER — Other Ambulatory Visit (HOSPITAL_COMMUNITY): Payer: Self-pay

## 2021-03-02 ENCOUNTER — Other Ambulatory Visit (HOSPITAL_COMMUNITY): Payer: Self-pay

## 2021-03-09 ENCOUNTER — Other Ambulatory Visit (HOSPITAL_COMMUNITY): Payer: Self-pay

## 2021-03-19 ENCOUNTER — Encounter: Payer: Self-pay | Admitting: Oncology

## 2021-04-09 ENCOUNTER — Other Ambulatory Visit (HOSPITAL_COMMUNITY): Payer: Self-pay

## 2021-04-14 ENCOUNTER — Other Ambulatory Visit (HOSPITAL_COMMUNITY): Payer: Self-pay

## 2021-04-23 ENCOUNTER — Other Ambulatory Visit (HOSPITAL_COMMUNITY): Payer: Self-pay

## 2021-04-29 ENCOUNTER — Other Ambulatory Visit (HOSPITAL_COMMUNITY): Payer: Self-pay

## 2021-06-11 ENCOUNTER — Other Ambulatory Visit: Payer: Self-pay | Admitting: Hematology and Oncology

## 2021-06-11 DIAGNOSIS — C8319 Mantle cell lymphoma, extranodal and solid organ sites: Secondary | ICD-10-CM

## 2021-06-11 DIAGNOSIS — C8311 Mantle cell lymphoma, lymph nodes of head, face, and neck: Secondary | ICD-10-CM

## 2021-06-11 DIAGNOSIS — C8598 Non-Hodgkin lymphoma, unspecified, lymph nodes of multiple sites: Secondary | ICD-10-CM

## 2021-06-12 ENCOUNTER — Telehealth: Payer: Self-pay | Admitting: *Deleted

## 2021-06-12 NOTE — Telephone Encounter (Signed)
Received call from pt. He states his eye is almost closed now due to swelling. He  sees Dr. Marland Mcalpine on Tuesday, 06/16/21 @ 10:45. Samuel Crane is asking who should schedule his MRI (?)-Dr. Lorenso Courier or Dr. Mina Marble.   Please advise

## 2021-06-14 ENCOUNTER — Encounter: Payer: Self-pay | Admitting: Hematology and Oncology

## 2021-06-16 ENCOUNTER — Ambulatory Visit: Admit: 2021-06-16 | Discharge: 2021-06-17 | Payer: MEDICARE

## 2021-06-16 DIAGNOSIS — Z9889 Other specified postprocedural states: Principal | ICD-10-CM

## 2021-06-16 DIAGNOSIS — H0589 Other disorders of orbit: Principal | ICD-10-CM

## 2021-06-16 MED ORDER — ERYTHROMYCIN 5 MG/GRAM (0.5 %) EYE OINTMENT
Freq: Three times a day (TID) | OPHTHALMIC | 0 refills | 0 days | Status: CP
Start: 2021-06-16 — End: ?

## 2021-06-17 ENCOUNTER — Telehealth: Payer: Self-pay | Admitting: *Deleted

## 2021-06-17 NOTE — Telephone Encounter (Signed)
Received call from Samuel Crane requesting follow up appt with Dr. Lorenso Courier to be scheduled after his scans on 06/24/21 Advised that we could see him on 06/26/21 @ 9:40 am   Samuel Crane is in agreement with this.

## 2021-06-24 ENCOUNTER — Ambulatory Visit (HOSPITAL_COMMUNITY)
Admission: RE | Admit: 2021-06-24 | Discharge: 2021-06-24 | Disposition: A | Discharge status: Home / Self Care | Payer: Medicare Other | Source: Ambulatory Visit | Attending: Hematology and Oncology | Admitting: Hematology and Oncology

## 2021-06-24 ENCOUNTER — Other Ambulatory Visit: Payer: Self-pay

## 2021-06-24 ENCOUNTER — Inpatient Hospital Stay: Payer: Medicare Other

## 2021-06-24 ENCOUNTER — Encounter: Payer: Self-pay | Admitting: Oncology

## 2021-06-24 ENCOUNTER — Inpatient Hospital Stay: Payer: Medicare Other | Attending: Hematology and Oncology

## 2021-06-24 DIAGNOSIS — C8310 Mantle cell lymphoma, unspecified site: Secondary | ICD-10-CM | POA: Insufficient documentation

## 2021-06-24 DIAGNOSIS — C8319 Mantle cell lymphoma, extranodal and solid organ sites: Secondary | ICD-10-CM | POA: Insufficient documentation

## 2021-06-24 DIAGNOSIS — C8598 Non-Hodgkin lymphoma, unspecified, lymph nodes of multiple sites: Secondary | ICD-10-CM | POA: Insufficient documentation

## 2021-06-24 DIAGNOSIS — C8311 Mantle cell lymphoma, lymph nodes of head, face, and neck: Secondary | ICD-10-CM

## 2021-06-24 LAB — CMP (CANCER CENTER ONLY)
ALT: 25 U/L (ref 0–44)
AST: 22 U/L (ref 15–41)
Albumin: 3.7 g/dL (ref 3.5–5.0)
Alkaline Phosphatase: 69 U/L (ref 38–126)
Anion gap: 9 (ref 5–15)
BUN: 9 mg/dL (ref 8–23)
CO2: 26 mmol/L (ref 22–32)
Calcium: 8.7 mg/dL — ABNORMAL LOW (ref 8.9–10.3)
Chloride: 106 mmol/L (ref 98–111)
Creatinine: 1.02 mg/dL (ref 0.61–1.24)
GFR, Estimated: >60 mL/min (ref >60)
Glucose, Bld: 86 mg/dL (ref 70–99)
Potassium: 4.2 mmol/L (ref 3.5–5.1)
Sodium: 141 mmol/L (ref 135–145)
Total Bilirubin: <0.2 mg/dL — ABNORMAL LOW (ref 0.3–1.2)
Total Protein: 6.8 g/dL (ref 6.5–8.1)

## 2021-06-24 LAB — CBC WITH DIFFERENTIAL (CANCER CENTER ONLY)
Abs Immature Granulocytes: 0.02 10*3/uL (ref 0.00–0.07)
Basophils Absolute: 0.1 10*3/uL (ref 0.0–0.1)
Basophils Relative: 1 %
Eosinophils Absolute: 0.3 10*3/uL (ref 0.0–0.5)
Eosinophils Relative: 5 %
HCT: 39.2 % (ref 39.0–52.0)
Hemoglobin: 13.1 g/dL (ref 13.0–17.0)
Immature Granulocytes: 0 %
Lymphocytes Relative: 30 %
Lymphs Abs: 1.7 10*3/uL (ref 0.7–4.0)
MCH: 32 pg (ref 26.0–34.0)
MCHC: 33.4 g/dL (ref 30.0–36.0)
MCV: 95.6 fL (ref 80.0–100.0)
Monocytes Absolute: 0.8 10*3/uL (ref 0.1–1.0)
Monocytes Relative: 14 %
Neutro Abs: 2.8 10*3/uL (ref 1.7–7.7)
Neutrophils Relative %: 50 %
Platelet Count: 313 10*3/uL (ref 150–400)
RBC: 4.1 MIL/uL — ABNORMAL LOW (ref 4.22–5.81)
RDW: 14.2 % (ref 11.5–15.5)
WBC Count: 5.6 10*3/uL (ref 4.0–10.5)
nRBC: 0 % (ref 0.0–0.2)

## 2021-06-24 MED ORDER — IOHEXOL 350 MG/ML SOLN
80.0000 mL | Freq: Once | INTRAVENOUS | Status: AC | PRN
  Administered 2021-06-24: 80 mL via INTRAVENOUS

## 2021-06-24 MED ORDER — GADOBUTROL 1 MMOL/ML IV SOLN
10.0000 mL | Freq: Once | INTRAVENOUS | Status: AC | PRN
  Administered 2021-06-24: 10 mL via INTRAVENOUS

## 2021-06-24 MED ORDER — SODIUM CHLORIDE (PF) 0.9 % IJ SOLN
INTRAMUSCULAR | Status: AC
  Filled 2021-06-24: qty 50

## 2021-06-26 ENCOUNTER — Inpatient Hospital Stay: Payer: Medicare Other

## 2021-06-26 ENCOUNTER — Other Ambulatory Visit: Payer: Self-pay | Admitting: Hematology and Oncology

## 2021-06-26 ENCOUNTER — Ambulatory Visit: Payer: Medicare Other | Admitting: Hematology and Oncology

## 2021-06-26 ENCOUNTER — Inpatient Hospital Stay (HOSPITAL_BASED_OUTPATIENT_CLINIC_OR_DEPARTMENT_OTHER): Payer: Medicare Other | Admitting: Hematology and Oncology

## 2021-06-26 ENCOUNTER — Other Ambulatory Visit: Payer: Self-pay

## 2021-06-26 VITALS — BP 140/86 | HR 71 | Temp 97.0°F | Resp 17 | Wt 220.8 lb

## 2021-06-26 DIAGNOSIS — C8311 Mantle cell lymphoma, lymph nodes of head, face, and neck: Secondary | ICD-10-CM | POA: Diagnosis not present

## 2021-06-26 DIAGNOSIS — C8319 Mantle cell lymphoma, extranodal and solid organ sites: Secondary | ICD-10-CM

## 2021-06-28 ENCOUNTER — Encounter: Payer: Self-pay | Admitting: Hematology and Oncology

## 2021-06-28 ENCOUNTER — Encounter: Payer: Self-pay | Admitting: Oncology

## 2021-06-28 NOTE — Progress Notes (Addendum)
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 Patient is s/p 3 Cycle of Ibrutinib/Rituximab. 12/31/2020-06/26/2021: patient lost to follow up. Declined rituximab and refused to continue ibrutinib.   Interval History:  Samuel Crane 60 y.o. male with medical history significant for relapsed mantle cell lymphoma who presents for a follow up visit.   On exam today [name] reports that he has discontinued ibrutinib therapy and has not taken any of it since he last saw [place] in June 2022.  Additionally we were to schedule him for further rituximab therapies but he declined.  Recently the patient has been developing pain behind his right eye and tearing.  He reports that his become difficult to see and that his right eye is his good eye and losing vision and that would be critical.  He notes that previously his ophthalmologist performed a biopsy of the mass behind his.  An MRI and CT scan of the chest were performed to determine if there was any other evidence of progressive lymphadenopathy.  He currently denies any fevers, chills, sweats, nausea, vomiting or diarrhea.  His weight has been stable.  He denies any lymphadenopathy elsewhere in his body.  A full 10 point ROS is listed below.  MEDICAL HISTORY:  Past Medical History:  Diagnosis Date   Alcohol abuse    Mantle cell lymphoma ([place])    Tobacco  abuse     SURGICAL HISTORY: No past surgical history on file.  SOCIAL HISTORY: Social History   Socioeconomic History   Marital status: Single    Spouse name: Not on file   Number of children: Not on file   Years of education: Not on file   Highest education level: Not on file  Occupational History   Not on file  Tobacco Use   Smoking status: Every Day   Smokeless tobacco: Never  Substance and Sexual Activity   Alcohol use: Not on file   Drug use: Not on file   Sexual activity: Not on file  Other Topics Concern   Not on file  Social History Narrative   Not on file   Social Determinants of Health   Financial Resource Strain: Not on file  Food Insecurity: Not on file  Transportation Needs: Not on file  Physical Activity: Not on file  Stress: Not on file  Social Connections: Not on file  Intimate Partner Violence: Not on file    FAMILY HISTORY: No family history on file.  ALLERGIES:  has no active allergies.  MEDICATIONS:  Current Outpatient Medications  Medication Sig Dispense Refill   acetaminophen (TYLENOL) 325 MG tablet Take by mouth.     celecoxib (CELEBREX) 100 MG capsule Take by mouth.     gabapentin (NEURONTIN) 100 MG capsule Take by mouth.     naproxen sodium (ALEVE) 220 MG tablet Take by mouth.     omeprazole (PRILOSEC) 20 MG capsule TAKE 1 CAPSULE BY MOUTH EVERY DAY 90 capsule 2   No current facility-administered medications for this visit.  REVIEW OF SYSTEMS:   Constitutional: ( - ) fevers, ( - )  chills , ( - ) night sweats Eyes: ( - ) blurriness of vision, ( - ) double vision, ( - ) watery eyes Ears, nose, mouth, throat, and face: ( - ) mucositis, ( - ) sore throat Respiratory: ( - ) cough, ( - ) dyspnea, ( - ) wheezes Cardiovascular: ( - ) palpitation, ( - ) chest discomfort, ( - ) lower extremity swelling Gastrointestinal:  ( - ) nausea, ( - ) heartburn, ( - ) change in bowel habits Skin: ( - ) abnormal skin rashes Lymphatics: ( - ) new  lymphadenopathy, ( - ) easy bruising Neurological: ( - ) numbness, ( - ) tingling, ( - ) new weaknesses Behavioral/Psych: ( - ) mood change, ( - ) new changes  All other systems were reviewed with the patient and are negative.  PHYSICAL EXAMINATION: ECOG PERFORMANCE STATUS: 0 - Asymptomatic  Vitals:   06/26/21 1211  BP: 140/86  Pulse: 71  Resp: 17  Temp: (!) 97 F (36.1 C)  SpO2: 97%   Filed Weights   06/26/21 1211  Weight: 220 lb 12.8 oz (100.2 kg)    GENERAL: well appearing middle aged Caucasian male in NAD  SKIN: skin color, texture, turgor are normal, no rashes or significant lesions EYES: swelling around the right eye, mild discoloration. Otherwise conjunctiva are pink and non-injected, sclera clear LUNGS: clear to auscultation and percussion with normal breathing effort HEART: regular rate & rhythm and no murmurs and no lower extremity edema Musculoskeletal: no cyanosis of digits and no clubbing  PSYCH: alert & oriented x 3, fluent speech NEURO: no focal motor/sensory deficits  LABORATORY DATA:  I have reviewed the data as listed CBC Latest Ref Rng & Units 06/24/2021 12/31/2020 10/27/2020  WBC 4.0 - 10.5 K/uL 5.6 8.2 5.5  Hemoglobin 13.0 - 17.0 g/dL 13.1 13.9 14.2  Hematocrit 39.0 - 52.0 % 39.2 40.6 43  Platelets 150 - 400 K/uL 313 303 296    CMP Latest Ref Rng & Units 06/24/2021 12/31/2020 10/27/2020  Glucose 70 - 99 mg/dL 86 82 -  BUN 8 - 23 mg/dL 9 14 20   Creatinine 0.61 - 1.24 mg/dL 1.02 0.92 0.9  Sodium 135 - 145 mmol/L 141 144 139  Potassium 3.5 - 5.1 mmol/L 4.2 4.4 4.1  Chloride 98 - 111 mmol/L 106 106 109(A)  CO2 22 - 32 mmol/L 26 26 24(A)  Calcium 8.9 - 10.3 mg/dL 8.7(L) 9.2 8.8  Total Protein 6.5 - 8.1 g/dL 6.8 7.1 -  Total Bilirubin 0.3 - 1.2 mg/dL <0.2(L) 0.3 -  Alkaline Phos 38 - 126 U/L 69 66 63  AST 15 - 41 U/L 22 16 24   ALT 0 - 44 U/L 25 13 21    RADIOGRAPHIC STUDIES: CT CHEST ABDOMEN PELVIS W CONTRAST  Result Date: 06/26/2021 CLINICAL DATA:   History of mantle cell lymphoma, assess for recurrence. EXAM: CT CHEST, ABDOMEN, AND PELVIS WITH CONTRAST TECHNIQUE: Multidetector CT imaging of the chest, abdomen and pelvis was performed following the standard protocol during bolus administration of intravenous contrast. CONTRAST:  39mL OMNIPAQUE IOHEXOL 350 MG/ML SOLN COMPARISON:  10/27/2020 PET-CT. FINDINGS: CT CHEST FINDINGS Cardiovascular: Normal heart size. No significant pericardial effusion/thickening. Great vessels are normal in course and caliber. No central pulmonary emboli. Mediastinum/Nodes: No discrete thyroid nodules. Unremarkable esophagus. No pathologically enlarged axillary, mediastinal or hilar lymph nodes. Lungs/Pleura: No pneumothorax. No pleural effusion. No acute consolidative airspace disease, lung masses  or significant pulmonary nodules. Musculoskeletal: No aggressive appearing focal osseous lesions. Mild thoracic spondylosis. CT ABDOMEN PELVIS FINDINGS Hepatobiliary: Normal liver with no liver mass. Cholecystectomy. No biliary ductal dilatation. Pancreas: Normal, with no mass or duct dilation. Spleen: Splenectomy. Stable small peripheral left upper quadrant splenules. Adrenals/Urinary Tract: Normal adrenals. Simple 1.2 cm upper right renal cyst. Otherwise normal kidneys, with no hydronephrosis. Normal bladder. Stomach/Bowel: Small hiatal hernia. Otherwise normal nondistended stomach. Normal caliber small bowel with no small bowel wall thickening. Normal appendix. Moderate diffuse colonic stool. No large bowel wall thickening, significant diverticulosis or acute pericolonic fat stranding. Vascular/Lymphatic: Atherosclerotic nonaneurysmal abdominal aorta. Patent portal, hepatic and renal veins. No pathologically enlarged lymph nodes in the abdomen or pelvis. Reproductive: Normal size prostate. Other: No pneumoperitoneum, ascites or focal fluid collection. Left omental 0.8 cm soft tissue nodule (series 2/image 77), stable, probably a small  splenule. Musculoskeletal: No aggressive appearing focal osseous lesions. Mild lumbar spondylosis. IMPRESSION: 1. Stable exam. No lymphadenopathy or other findings of recurrent lymphoma in the chest, abdomen or pelvis. 2. Moderate diffuse colonic stool, which may indicate constipation. 3. Small hiatal hernia. 4. Aortic Atherosclerosis (ICD10-I70.0). Electronically Signed   By: Ilona Sorrel M.D.   On: 06/26/2021 16:50   MR ORBITS W WO CONTRAST  Result Date: 06/26/2021 CLINICAL DATA:  History of mantle cell lymphoma, patient having sensation behind left eye in EXAM: MRI OF THE ORBITS WITHOUT AND WITH CONTRAST TECHNIQUE: Multiplanar, multi-echo pulse sequences of the orbits and surrounding structures were acquired including fat saturation techniques, before and after intravenous contrast administration. CONTRAST:  51mL GADAVIST GADOBUTROL 1 MMOL/ML IV SOLN COMPARISON:  01/28/2020 MRI head FINDINGS: Orbits: Enhancing mass in the medial RIGHT orbit, medial to the medial rectus and globe, which measures up to 3.1 x 1.4 x 3.3 cm (AP x TR x CC) (series 11, image 9 and series 12, image 24), previously up to 2.2 x 1.0 x 2.5 cm when remeasured similarly. Moderate proptosis, which has increased since the prior exam. There has been some remodeling of the right lamina papyracea. Otherwise normal appearance of the right extraocular muscles, orbital fat, optic nerve sheath complexes, and lacrimal glands. Normal appearance of the left orbit. No evidence of left orbital mass or inflammation. Normal appearance of the left globe, optic nerve sheath complex, extraocular muscles, orbital fat and lacrimal glands. Visualized sinuses: Mild mucosal enhancement in the ethmoid air cells. Otherwise unremarkable. Soft tissues: Normal. Previously noted left temporal lesion is no longer seen. Limited intracranial: No acute or significant finding. IMPRESSION: 1. Interval enlargement of an enhancing mass in the medial right orbit, compared to  01/28/2020, although this lesion was reportedly treated in the interval. Mass effect causes mild right proptosis, although the remaining contents of the right orbit are otherwise normal. 2. Normal left orbit. Electronically Signed   By: Merilyn Baba M.D.   On: 06/26/2021 20:34    ASSESSMENT & PLAN Samuel Crane 60 y.o. male with medical history significant for relapsed mantle cell lymphoma who presents for a follow up visit.   After review of the labs, review of the records, and discussion with the patient the patients findings were most consistent with an excellent response to ibrutinib and rituximab therapy for a relapsed mantle cell lymphoma.  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 The patient had a falling  out with his last Oncology provider over concerns regarding NGS testing.  We similarly had a discussion regarding NGS testing which frustrated the patient let him to be lost to follow-up for the last 6 months.  The patient was lost to follow-up for approximately 6 months time from June 2022 until late November 2022.  He is having swelling of his right eye with concern for recurrence of his lymphoma.  We have ordered imaging studies which are available today but without formal reads.  As such we are awaiting the formal reads and if there is found to be a enlarging mass behind his right eye (as my read has shown) I will recommend pursuing a biopsy to confirm relapse.  # Relapsed Mantle Cell Lymphoma --patient has responded well to ibrutinib/rituximab therapy, having achieved a CR based on PET CT scan from March 2022 after 3 cycles of Ibrutinib/Rituximab therapy.  -- Patient self discontinued ibrutinib therapy and did not schedule further rituximab therapies. --Patient now  presents with swelling behind his right eye concerning for recurrence of his mantle cell lymphoma --MRI brain ordered as well as CT scan of the chest abdomen pelvis.  Per my reads there is a new enlarging mass behind the right eye but no clear evidence of lymphadenopathy on the CT scan of the chest abdomen pelvis. --We will request that the patient contact his ophthalmologist for consideration of a biopsy of this mass to confirm relapse --I am concerned given his multiple lines of therapy that we are running out of options for treatment.  CAR-T therapy could certainly be an option but he would have discussed this with [facility].  He notes that he wants to do "as little as possible" --Return to clinic pending the results of the biopsy.  #Patient Request for NGS Testing -- Given that he now has recurrence of his tumor ordering the NGS testing requested is now feasible because there is tissue to biopsy and sample. --We will pursue the requested testing once ophthalmology has obtained tissue from the mass behind his eye.  No orders of the defined types were placed in this encounter.   All questions were answered. The patient knows to call the clinic with any problems, questions or concerns.  A total of more than 40 minutes were spent on this encounter with face-to-face time and non-face-to-face time, including preparing to see the patient, ordering tests and/or medications, counseling the patient and coordination of care as outlined above.   Ledell Peoples, MD Department of Hematology/Oncology [place] at [facility] Phone: [phone] Pager: [phone] Email: [email or web address]  06/28/2021 3:12 PM   Literature Supportive:  Rise Patience, Wagner-Bartak N, [place] F, [place], [place], [place] F, [place] F, [place] W, [place], [place], Dwight D, Lam L, [place] A, Carolynn Comment, Li S, Santos D,  [address], [place], [place], Zhang L. Ibrutinib in combination with rituximab in relapsed or refractory mantle cell lymphoma: a single-centre, open-label, phase 2 trial. Lancet Oncol. 2016 Jan;17(1):48-56  --At a median follow-up of 165 months (IQR 2355-7322), 44 (88%, 95% CI 757-955) patients achieved an objective response, with 22 (44%, 300-587) patients achieving a complete response, and 22 (44%, 300-587) a partial response.

## 2021-06-29 ENCOUNTER — Encounter: Payer: Self-pay | Admitting: Hematology and Oncology

## 2021-07-03 ENCOUNTER — Ambulatory Visit
Admit: 2021-07-03 | Discharge: 2021-07-04 | Payer: MEDICARE | Attending: Student in an Organized Health Care Education/Training Program | Primary: Student in an Organized Health Care Education/Training Program

## 2021-07-03 ENCOUNTER — Telehealth: Payer: Self-pay | Admitting: *Deleted

## 2021-07-03 MED ORDER — ERYTHROMYCIN 5 MG/GRAM (0.5 %) EYE OINTMENT
Freq: Three times a day (TID) | OPHTHALMIC | 0 refills | 0 days | Status: CP
Start: 2021-07-03 — End: ?

## 2021-07-03 NOTE — Telephone Encounter (Signed)
TCT patient regarding results of recent MRI and CT scan. Spoke to patient and advised that there was a soft tissue mass behind the right eye and increased in size from prior imaging. Advised that the CT C/A/P did not show any lymphadenopathy.  Pt became very agitated and irritated on the phone about who was reading these scans and how do they know if it recurrence of tumor. Attempted to explain that the scans are reviewed and read by trained radiologists and it showed a "mass" in the same area as he had previously and that a biopsy is necessary for definitive diagnosis.  Unable to complete any further sentences/conversation as pt raising his voice on this call. He did say he was at the eye clinic. Pt hung up before any further conversation was had.  Dr. Lorenso Courier made aware.

## 2021-07-03 NOTE — Telephone Encounter (Signed)
-----  Message from Ledell Peoples IV, MD sent at 07/02/2021 12:04 PM EST ----- Please let Mr. Gerdeman know that his MRI brain did show a soft tissue mass behind the right eye, increased in size from prior imaging. Fortunately the CT C/A/P showed no evidence of lymphadenopathy. I would encourage him to reach out to this ophthalmologist regarding biopsy of this mass. We will send the tissue from that biopsy for the requested studies (SOX 11, Ki67, etc).  ----- Message ----- From: Interface, Rad Results In Sent: 06/26/2021   8:37 PM EST To: Orson Slick, MD

## 2021-07-06 DIAGNOSIS — H0589 Other disorders of orbit: Principal | ICD-10-CM

## 2021-07-08 ENCOUNTER — Encounter: Payer: Self-pay | Admitting: Hematology and Oncology

## 2021-07-08 ENCOUNTER — Telehealth: Payer: Self-pay

## 2021-07-08 NOTE — Telephone Encounter (Signed)
T/C from pt stating he cannot get access to his films from 06/24/21 to come up in My Chart.   I called radiology and was told to call [facility] and have them re send them. They were re sent and pt was advised to re ck My Chart .  Pt was also advised if they were not in My Chart he can call and speak with Radiology and request a disc.  Pt with understanding

## 2021-07-14 ENCOUNTER — Other Ambulatory Visit (HOSPITAL_COMMUNITY): Payer: Self-pay

## 2021-07-16 ENCOUNTER — Other Ambulatory Visit (HOSPITAL_COMMUNITY): Payer: Self-pay

## 2021-08-05 ENCOUNTER — Other Ambulatory Visit: Payer: Self-pay | Admitting: Pharmacist

## 2021-08-12 ENCOUNTER — Telehealth: Payer: Self-pay

## 2021-08-12 NOTE — Telephone Encounter (Signed)
Patient requested that he be referred back to Dr. Elita Boone at [facility]. Spoke with [employer] [phone] to request apt ASAP per pts request. Clinical information faxed to [phone]. Amber to reach out to patient to schedule apt. Requested that Amber called back to confirm that patient has been scheduled.

## 2021-08-14 ENCOUNTER — Inpatient Hospital Stay: Payer: Medicare Other | Admitting: Hematology and Oncology

## 2021-08-14 ENCOUNTER — Inpatient Hospital Stay: Payer: Medicare Other

## 2021-08-25 ENCOUNTER — Other Ambulatory Visit (HOSPITAL_COMMUNITY): Payer: Self-pay

## 2022-04-16 IMAGING — MR MR ORBITS WO/W CM
7 of 8 series · 43 of 48 positions shown · IV contrast (gadavist)
Comparison: 01/28/2020 MRI head

CLINICAL DATA: History of mantle cell lymphoma, patient having
sensation behind left eye in

EXAM:
MRI OF THE ORBITS WITHOUT AND WITH CONTRAST
TECHNIQUE: Multiplanar, multi-echo pulse sequences of the orbits and
surrounding structures were acquired including fat saturation
techniques, before and after intravenous contrast administration.
CONTRAST:  10mL GADAVIST GADOBUTROL 1 MMOL/ML IV SOLN

[Series 5: T1 · sagittal · 3.5mm · 0.75mm/px · 5 of 24 slices shown (1 of 3)]
[im 1/24]
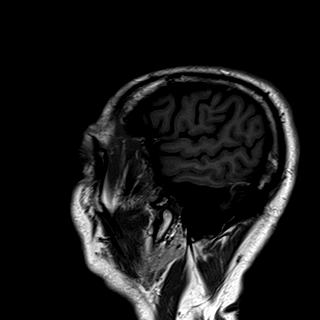
[im 6/24]
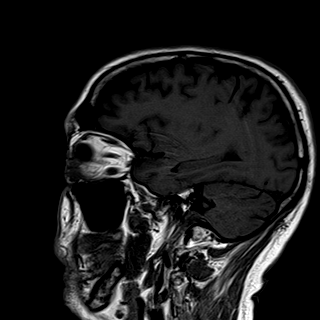
[im 12/24]
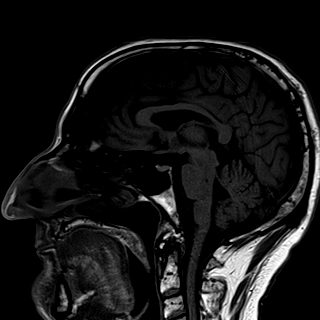
[im 18/24]
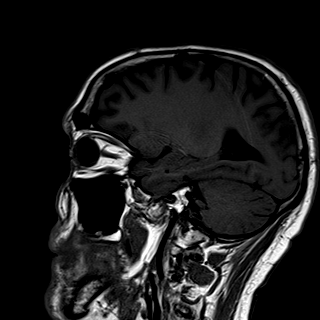
[im 24/24]
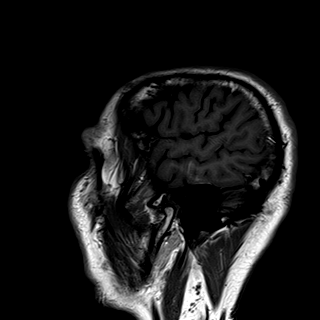

[Series 7: T1 · axial · 3.0mm · 0.56mm/px · z∈[-48,+14]mm · 4 of 20 slices shown (2 of 3)]
[im 1/20]
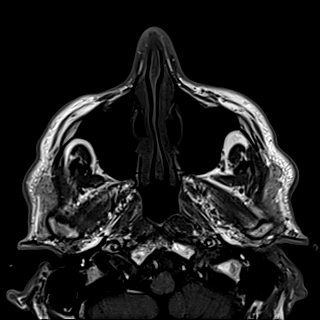
[im 7/20]
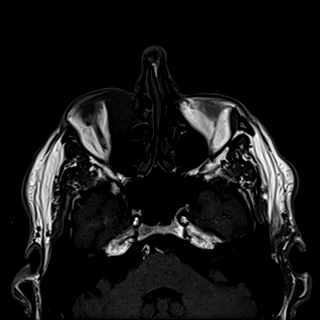
[im 13/20]
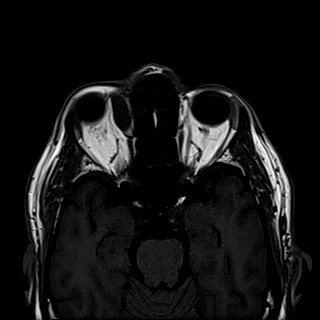
[im 20/20]
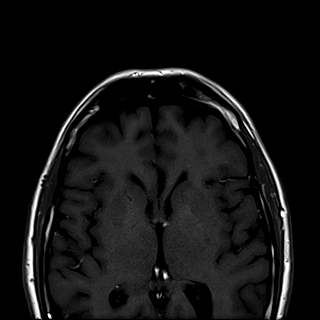

[Series 8: T2 fat-sat · coronal · 3.0mm · 0.47mm/px · 8 of 32 slices shown (1 of 2)]
[im 1/32]
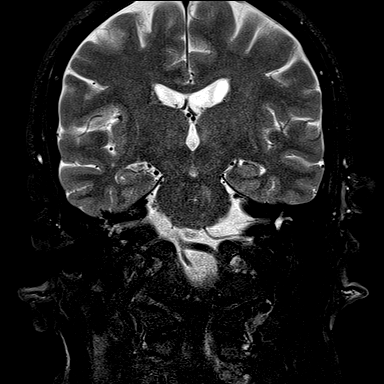
[im 5/32]
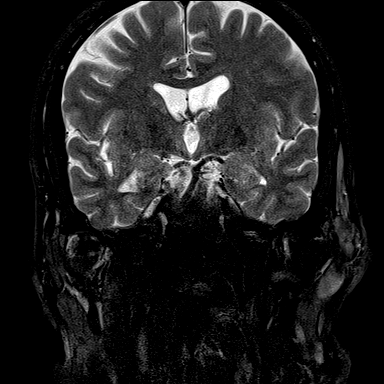
[im 9/32]
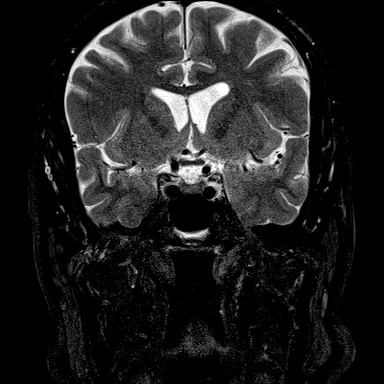
[im 14/32]
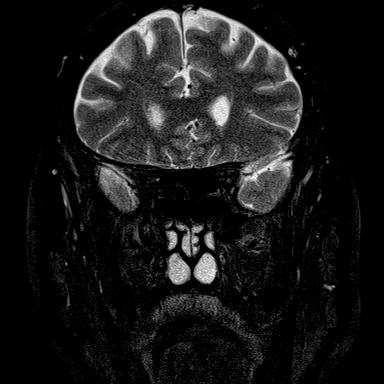
[im 18/32]
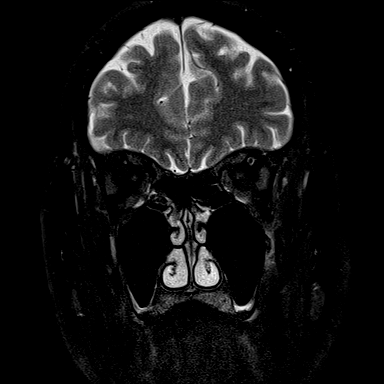
[im 23/32]
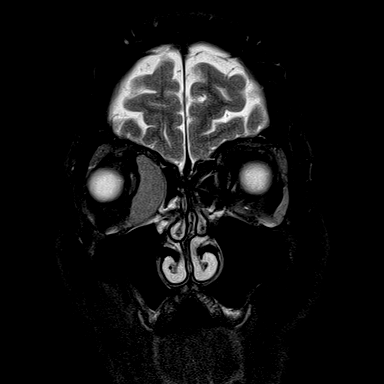
[im 27/32]
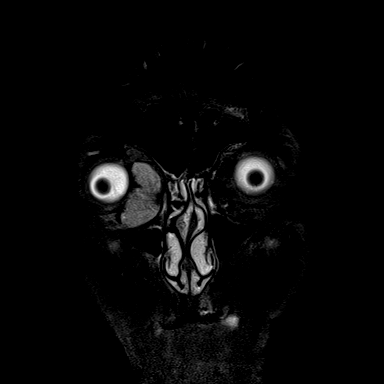
[im 32/32]
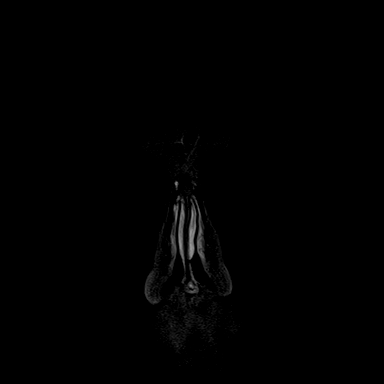

[Series 9: T1 · coronal · 3.0mm · 0.56mm/px · 8 of 32 slices shown (3 of 3)]
[im 1/32]
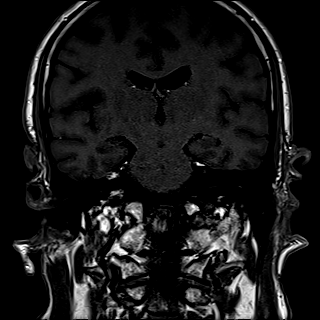
[im 5/32]
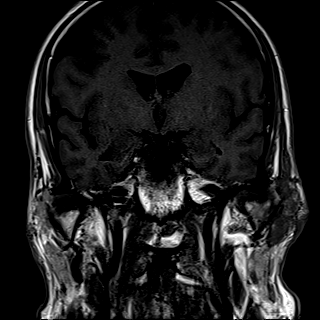
[im 9/32]
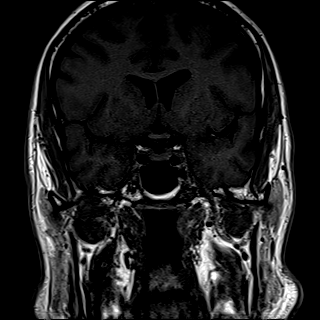
[im 14/32]
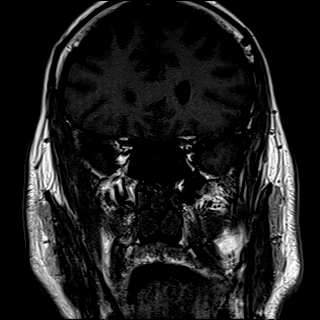
[im 18/32]
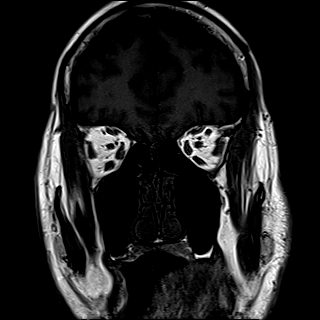
[im 23/32]
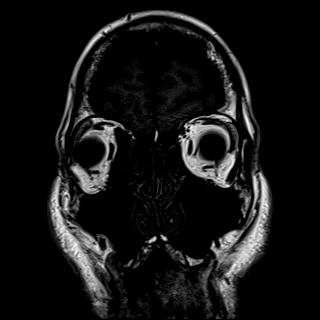
[im 27/32]
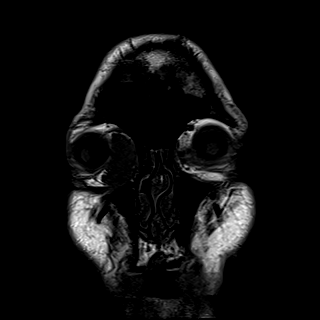
[im 32/32]
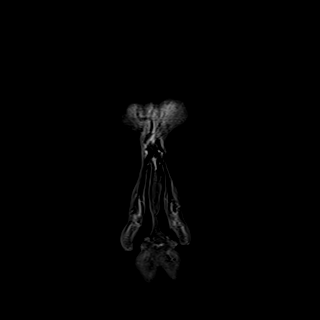

[Series 10: T2 fat-sat · axial · 3.0mm · 0.47mm/px · z∈[-45,+16]mm · 5 of 20 slices shown (2 of 2)]
[im 1/20]
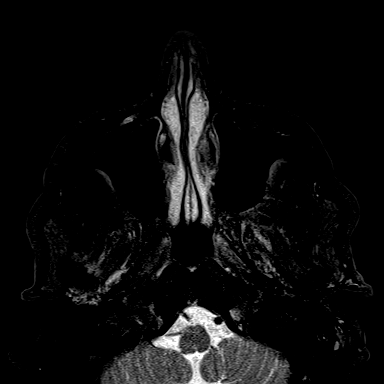
[im 5/20]
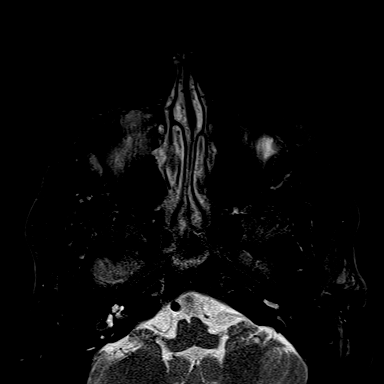
[im 10/20]
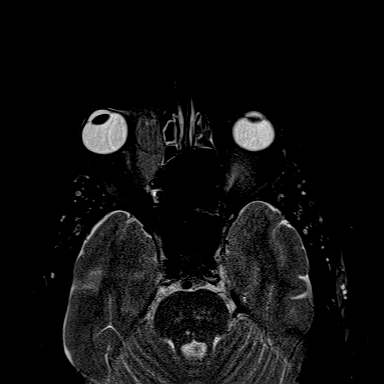
[im 15/20]
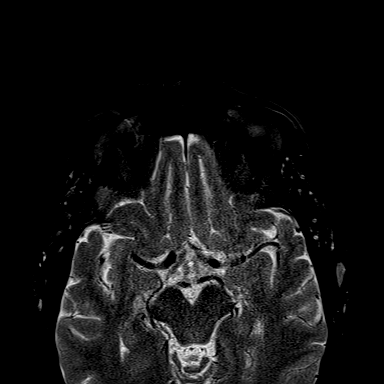
[im 20/20]
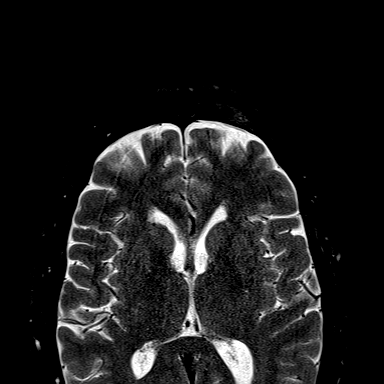

[Series 11: T1 fat-sat post-contrast · axial · 3.0mm · 0.56mm/px · z∈[-45,+16]mm · 5 of 20 slices shown (1 of 2)]
[im 1/20]
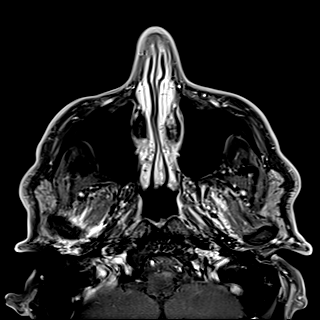
[im 5/20]
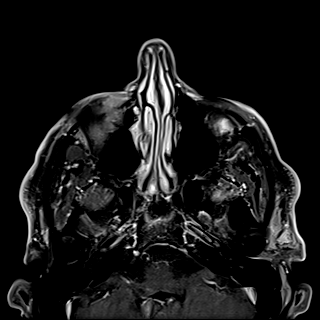
[im 10/20]
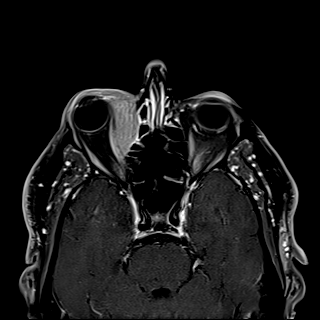
[im 15/20]
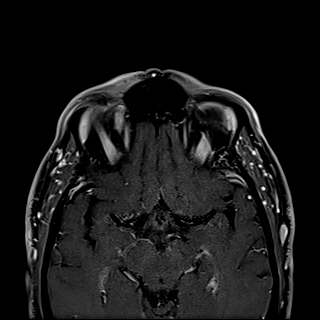
[im 20/20]
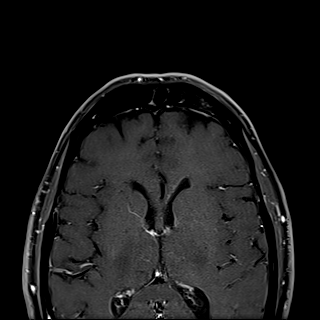

[Series 12: T1 fat-sat post-contrast · coronal · 3.0mm · 0.70mm/px · 8 of 32 slices shown (2 of 2)]
[im 1/32]
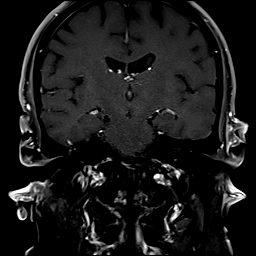
[im 5/32]
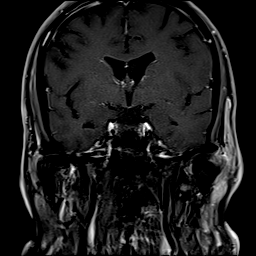
[im 9/32]
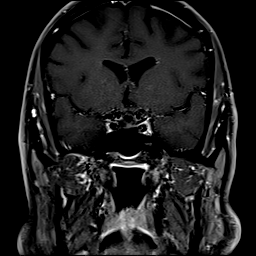
[im 14/32]
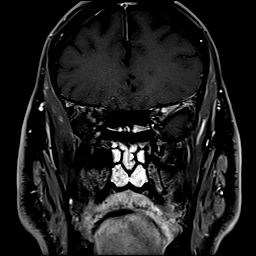
[im 18/32]
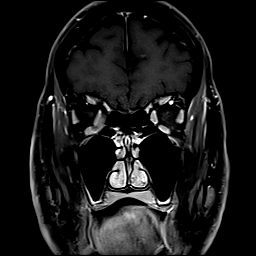
[im 23/32]
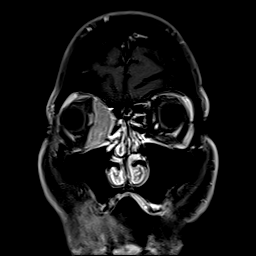
[im 27/32]
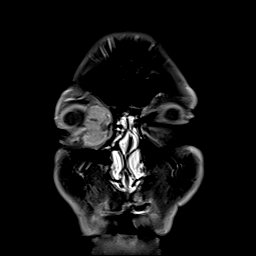
[im 32/32]
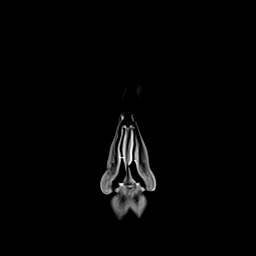

[43 of 48 positions shown; findings below may reference images not displayed]

FINDINGS: Orbits: Enhancing mass in the medial RIGHT orbit, medial to the
medial rectus and globe, which measures up to 3.1 x 1.4 x 3.3 cm (AP
x TR x CC) (series 11, image 9 and series 12, image 24), previously
up to 2.2 x 1.0 x 2.5 cm when remeasured similarly.

Moderate proptosis, which has increased since the prior exam. There
has been some remodeling of the right lamina papyracea. Otherwise
normal appearance of the right extraocular muscles, orbital fat,
optic nerve sheath complexes, and lacrimal glands.

Normal appearance of the left orbit. No evidence of left orbital
mass or inflammation. Normal appearance of the left globe, optic
nerve sheath complex, extraocular muscles, orbital fat and lacrimal
glands.

Visualized sinuses: Mild mucosal enhancement in the ethmoid air
cells. Otherwise unremarkable.

Soft tissues: Normal. Previously noted left temporal lesion is no
longer seen.

Limited intracranial: No acute or significant finding.
IMPRESSION: 1. Interval enlargement of an enhancing mass in the medial right
orbit, compared to 01/28/2020, although this lesion was reportedly
treated in the interval. Mass effect causes mild right proptosis,
although the remaining contents of the right orbit are otherwise
normal.
2. Normal left orbit.

## 2022-04-16 IMAGING — CT CT CHEST-ABD-PELV W/ CM
3 of 5 series · 15 of 36 positions shown, 17 images · IV contrast (omnipaque)
Comparison: 10/27/2020 PET-CT.

CLINICAL DATA: History of mantle cell lymphoma, assess for
recurrence.

EXAM:
CT CHEST, ABDOMEN, AND PELVIS WITH CONTRAST
TECHNIQUE: Multidetector CT imaging of the chest, abdomen and pelvis was
performed following the standard protocol during bolus
administration of intravenous contrast.
CONTRAST:  80mL OMNIPAQUE IOHEXOL 350 MG/ML SOLN

[Series 2: cap with · axial · 0.86mm/px · z∈[-298,+212]mm · 9 of 128 slices shown, 11 images]
[im 13/128  mediastinal]
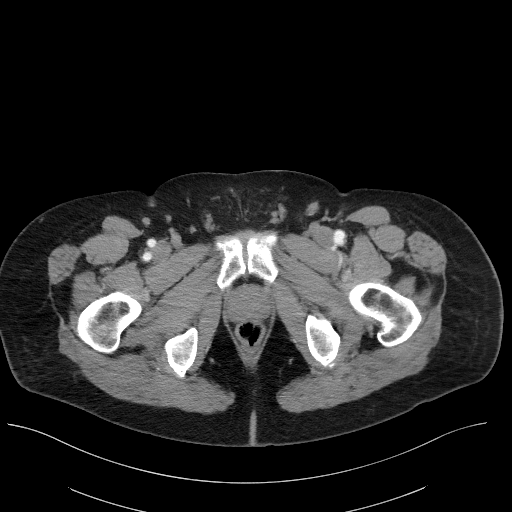
[im 13/128  bone]
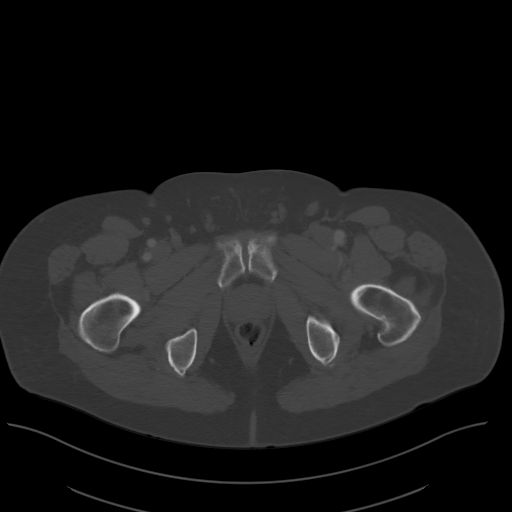
[im 26/128  mediastinal]
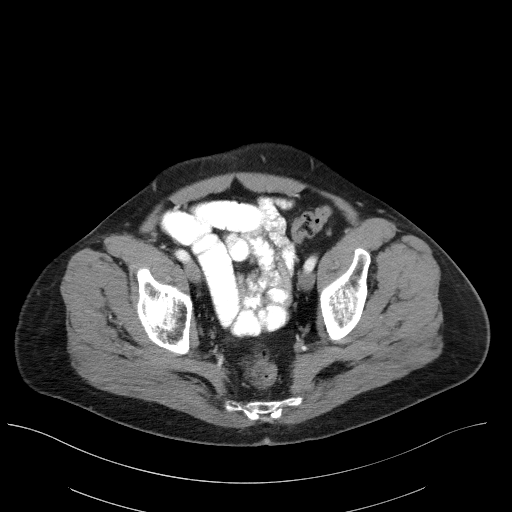
[im 39/128  mediastinal]
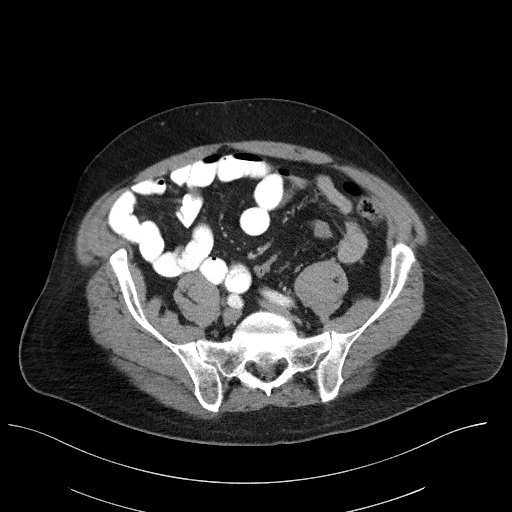
[im 51/128  mediastinal]
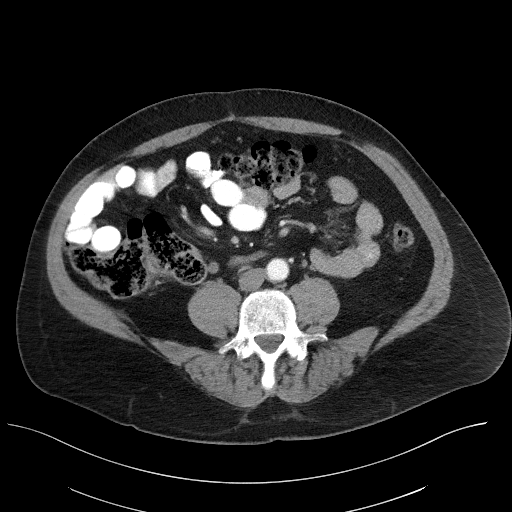
[im 64/128  mediastinal]
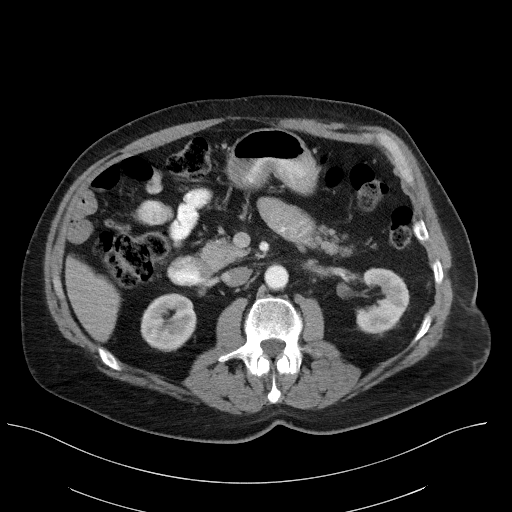
[im 77/128  mediastinal]
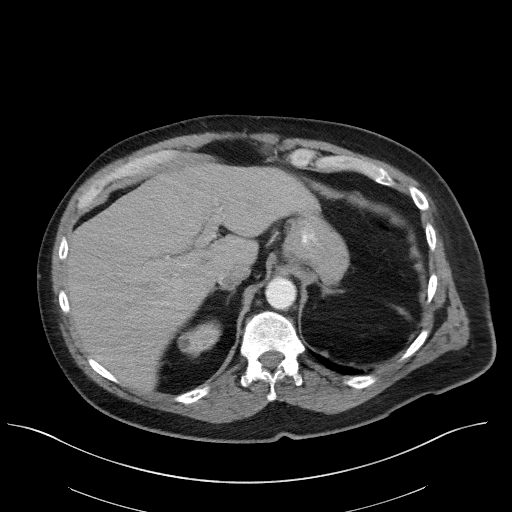
[im 89/128  mediastinal]
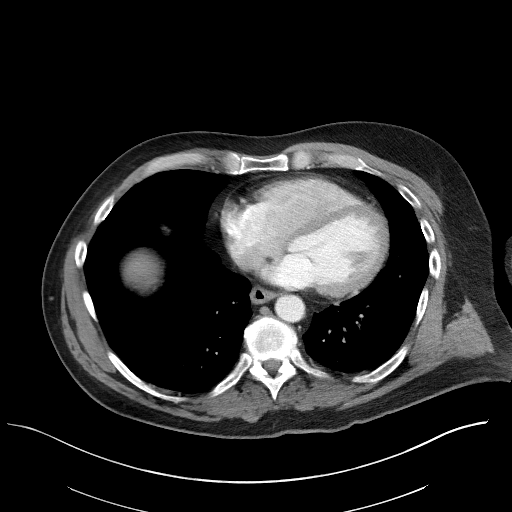
[im 102/128  mediastinal]
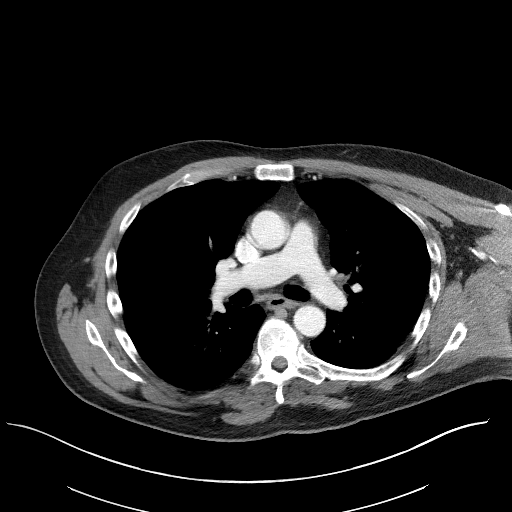
[im 115/128  mediastinal]
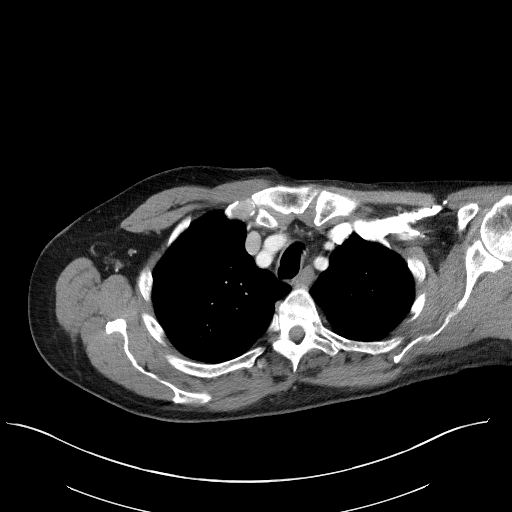
[im 115/128  bone]
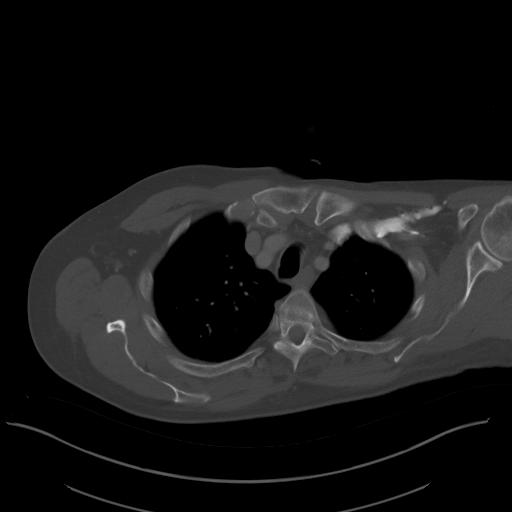

[Series 4: lung · axial · 0.86mm/px · z∈[-4,+66]mm · 3 of 151 slices shown]
[im 12/151  bone]
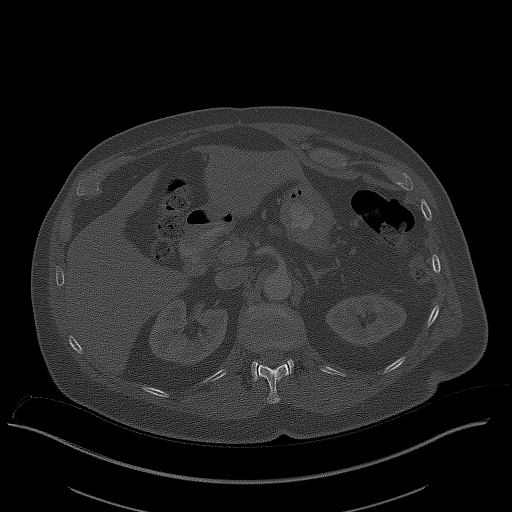
[im 35/151  bone]
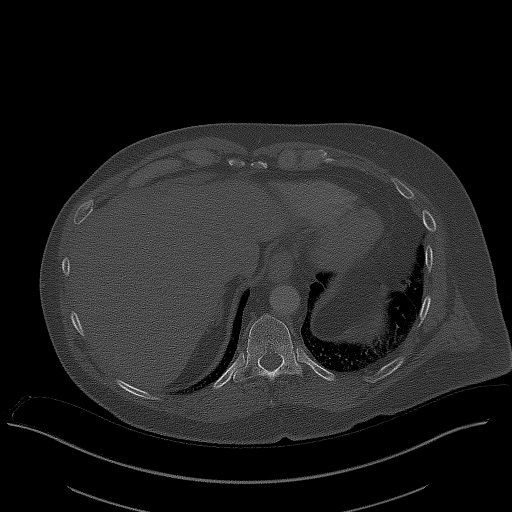
[im 47/151  bone]
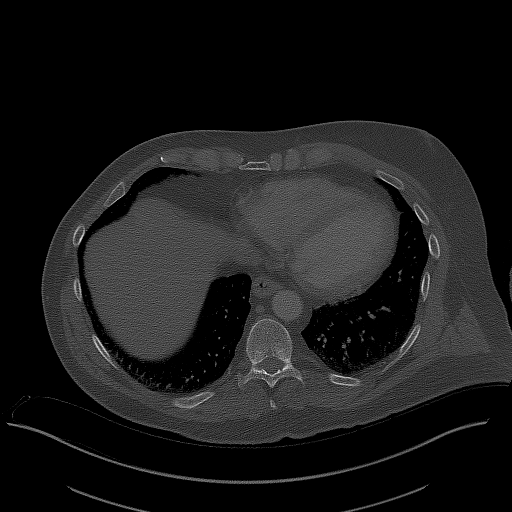

[Series 5: coronals · coronal · 0.97mm/px · 3 of 134 slices shown]
[im 27/134  mediastinal]
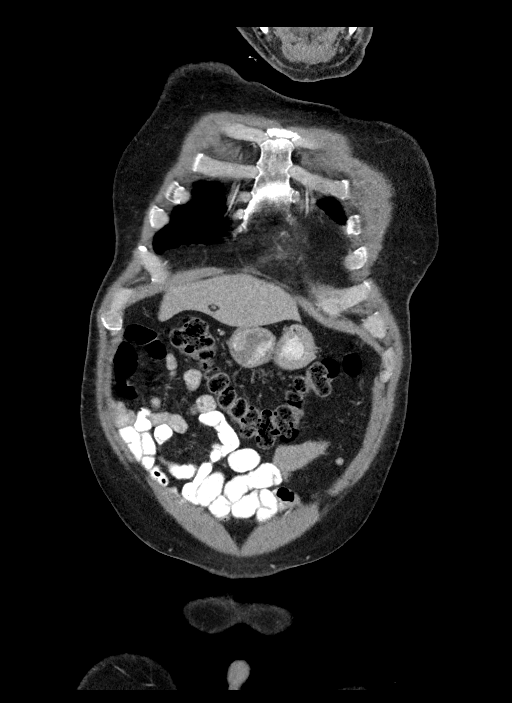
[im 54/134  mediastinal]
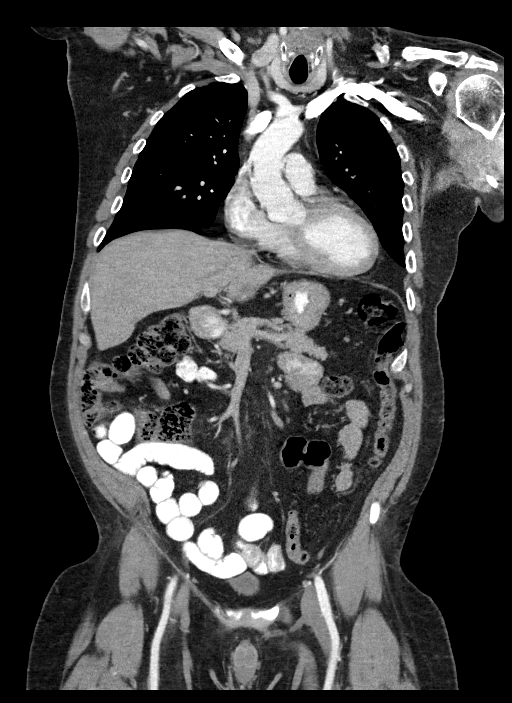
[im 80/134  mediastinal]
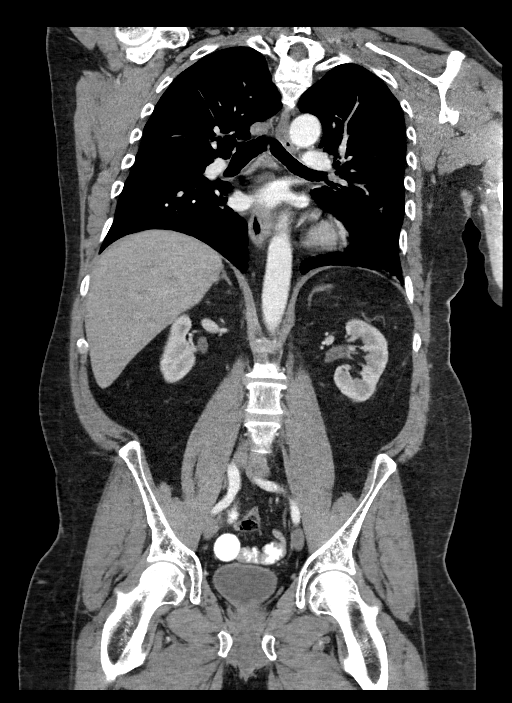

[15 of 36 positions shown; findings below may reference images not displayed]

FINDINGS: CT CHEST FINDINGS

Cardiovascular: Normal heart size. No significant pericardial
effusion/thickening. Great vessels are normal in course and caliber.
No central pulmonary emboli.

Mediastinum/Nodes: No discrete thyroid nodules. Unremarkable
esophagus. No pathologically enlarged axillary, mediastinal or hilar
lymph nodes.

Lungs/Pleura: No pneumothorax. No pleural effusion. No acute
consolidative airspace disease, lung masses or significant pulmonary
nodules.

Musculoskeletal: No aggressive appearing focal osseous lesions. Mild
thoracic spondylosis.

CT ABDOMEN PELVIS FINDINGS

Hepatobiliary: Normal liver with no liver mass. Cholecystectomy. No
biliary ductal dilatation.

Pancreas: Normal, with no mass or duct dilation.

Spleen: Splenectomy. Stable small peripheral left upper quadrant
splenules.

Adrenals/Urinary Tract: Normal adrenals. Simple 1.2 cm upper right
renal cyst. Otherwise normal kidneys, with no hydronephrosis. Normal
bladder.

Stomach/Bowel: Small hiatal hernia. Otherwise normal nondistended
stomach. Normal caliber small bowel with no small bowel wall
thickening. Normal appendix. Moderate diffuse colonic stool. No
large bowel wall thickening, significant diverticulosis or acute
pericolonic fat stranding.

Vascular/Lymphatic: Atherosclerotic nonaneurysmal abdominal aorta.
Patent portal, hepatic and renal veins. No pathologically enlarged
lymph nodes in the abdomen or pelvis.

Reproductive: Normal size prostate.

Other: No pneumoperitoneum, ascites or focal fluid collection. Left
omental 0.8 cm soft tissue nodule (series 2/image 77), stable,
probably a small splenule.

Musculoskeletal: No aggressive appearing focal osseous lesions. Mild
lumbar spondylosis.
IMPRESSION: 1. Stable exam. No lymphadenopathy or other findings of recurrent
lymphoma in the chest, abdomen or pelvis.
2. Moderate diffuse colonic stool, which may indicate constipation.
3. Small hiatal hernia.
4. Aortic Atherosclerosis ([id number]).

## 2022-08-12 ENCOUNTER — Emergency Department
Admit: 2022-08-12 | Discharge: 2022-08-13 | Disposition: A | Discharge status: Other Acute Inpatient Hospital | Payer: MEDICARE | Attending: Family Medicine

## 2022-08-12 ENCOUNTER — Ambulatory Visit
Admit: 2022-08-12 | Discharge: 2022-08-13 | Disposition: A | Discharge status: Other Acute Inpatient Hospital | Payer: MEDICARE | Attending: Family Medicine

## 2022-08-13 ENCOUNTER — Ambulatory Visit
Admit: 2022-08-13 | Discharge: 2022-08-14 | Disposition: A | Discharge status: Home / Self Care | Payer: MEDICARE | Source: Other Acute Inpatient Hospital

## 2022-08-14 MED ORDER — ROSUVASTATIN 20 MG TABLET
ORAL_TABLET | Freq: Every day | ORAL | 0 refills | 30 days
Start: 2022-08-14 — End: 2022-08-14

## 2022-08-14 MED ORDER — XARELTO 20 MG TABLET
ORAL_TABLET | Freq: Every morning | ORAL | 0 refills | 30 days
Start: 2022-08-14 — End: 2022-08-14

## 2022-08-14 MED ORDER — APIXABAN 5 MG TABLET
ORAL_TABLET | ORAL | 0 refills | 89.00000 days | Status: CN
Start: 2022-08-14 — End: 2022-11-11

## 2022-08-14 MED ORDER — NICOTINE (POLACRILEX) 4 MG BUCCAL LOZENGE
BUCCAL | 0 refills | 5 days | Status: CN | PRN
Start: 2022-08-14 — End: 2022-09-13

## 2022-08-14 MED ORDER — XARELTO 15 MG TABLET
ORAL_TABLET | Freq: Every day | ORAL | 0 refills | 42 days
Start: 2022-08-14 — End: 2022-08-14

## 2022-08-14 MED ORDER — ELIQUIS 5 MG TABLET
ORAL_TABLET | 0 refills | 0 days
Start: 2022-08-14 — End: 2022-08-14

## 2022-08-14 MED ORDER — NICOTINE (POLACRILEX) 4 MG GUM
BUCCAL | 0 refills | 5 days | Status: CN | PRN
Start: 2022-08-14 — End: 2022-09-13

## 2022-08-15 MED ORDER — NICOTINE 21 MG/24 HR DAILY TRANSDERMAL PATCH
MEDICATED_PATCH | Freq: Every day | TRANSDERMAL | 0 refills | 28 days | Status: CN
Start: 2022-08-15 — End: ?
